# Patient Record
Sex: Female | Born: 1950 | Race: White | Hispanic: No | Marital: Married | Smoking: Never smoker
Health system: Southern US, Community
[De-identification: ages and names within clinical notes are randomized; demographics above are authoritative.]

## PROBLEM LIST (undated history)

## (undated) DIAGNOSIS — R519 Headache, unspecified: Secondary | ICD-10-CM

## (undated) DIAGNOSIS — K5900 Constipation, unspecified: Secondary | ICD-10-CM

## (undated) DIAGNOSIS — R51 Headache: Secondary | ICD-10-CM

## (undated) DIAGNOSIS — M1990 Unspecified osteoarthritis, unspecified site: Secondary | ICD-10-CM

## (undated) HISTORY — PX: TONSILLECTOMY: SUR1361

## (undated) HISTORY — PX: JOINT REPLACEMENT: SHX530

## (undated) HISTORY — PX: APPENDECTOMY: SHX54

## (undated) HISTORY — PX: TUBAL LIGATION: SHX77

## (undated) HISTORY — PX: EYE SURGERY: SHX253

## (undated) HISTORY — PX: OLECRANON BURSECTOMY: SHX2097

## (undated) HISTORY — PX: CARPAL TUNNEL RELEASE: SHX101

## (undated) HISTORY — PX: HERNIA REPAIR: SHX51

---

## 1998-03-17 ENCOUNTER — Ambulatory Visit (HOSPITAL_COMMUNITY): Admission: RE | Admit: 1998-03-17 | Discharge: 1998-03-17 | Payer: Self-pay | Admitting: Emergency Medicine

## 1998-07-04 ENCOUNTER — Emergency Department (HOSPITAL_COMMUNITY): Admission: EM | Admit: 1998-07-04 | Discharge: 1998-07-04 | Payer: Self-pay | Admitting: Emergency Medicine

## 1998-10-19 ENCOUNTER — Encounter: Payer: Self-pay | Admitting: Obstetrics and Gynecology

## 1998-10-19 ENCOUNTER — Ambulatory Visit: Admission: RE | Admit: 1998-10-19 | Discharge: 1998-10-19 | Payer: Self-pay | Admitting: Obstetrics and Gynecology

## 1999-12-15 ENCOUNTER — Encounter: Payer: Self-pay | Admitting: Obstetrics and Gynecology

## 1999-12-15 ENCOUNTER — Encounter: Admission: RE | Admit: 1999-12-15 | Discharge: 1999-12-15 | Payer: Self-pay | Admitting: Obstetrics and Gynecology

## 1999-12-29 ENCOUNTER — Encounter: Payer: Self-pay | Admitting: Obstetrics and Gynecology

## 1999-12-29 ENCOUNTER — Encounter: Admission: RE | Admit: 1999-12-29 | Discharge: 1999-12-29 | Payer: Self-pay | Admitting: Obstetrics and Gynecology

## 2000-01-02 ENCOUNTER — Ambulatory Visit (HOSPITAL_COMMUNITY): Admission: RE | Admit: 2000-01-02 | Discharge: 2000-01-02 | Payer: Self-pay | Admitting: Radiology

## 2000-01-02 ENCOUNTER — Encounter (INDEPENDENT_AMBULATORY_CARE_PROVIDER_SITE_OTHER): Payer: Self-pay | Admitting: Specialist

## 2000-01-02 ENCOUNTER — Other Ambulatory Visit: Admission: RE | Admit: 2000-01-02 | Discharge: 2000-01-02 | Payer: Self-pay | Admitting: Obstetrics and Gynecology

## 2001-01-09 ENCOUNTER — Encounter: Payer: Self-pay | Admitting: Obstetrics and Gynecology

## 2001-01-09 ENCOUNTER — Ambulatory Visit (HOSPITAL_COMMUNITY): Admission: RE | Admit: 2001-01-09 | Discharge: 2001-01-09 | Payer: Self-pay | Admitting: Obstetrics and Gynecology

## 2001-03-04 ENCOUNTER — Other Ambulatory Visit: Admission: RE | Admit: 2001-03-04 | Discharge: 2001-03-04 | Payer: Self-pay | Admitting: Obstetrics and Gynecology

## 2002-01-13 ENCOUNTER — Ambulatory Visit (HOSPITAL_COMMUNITY): Admission: RE | Admit: 2002-01-13 | Discharge: 2002-01-13 | Payer: Self-pay | Admitting: Obstetrics and Gynecology

## 2002-01-13 ENCOUNTER — Encounter: Payer: Self-pay | Admitting: Obstetrics and Gynecology

## 2002-03-06 ENCOUNTER — Other Ambulatory Visit: Admission: RE | Admit: 2002-03-06 | Discharge: 2002-03-06 | Payer: Self-pay | Admitting: Obstetrics and Gynecology

## 2002-04-23 ENCOUNTER — Ambulatory Visit (HOSPITAL_COMMUNITY): Admission: RE | Admit: 2002-04-23 | Discharge: 2002-04-23 | Payer: Self-pay | Admitting: Gastroenterology

## 2002-04-23 ENCOUNTER — Encounter (INDEPENDENT_AMBULATORY_CARE_PROVIDER_SITE_OTHER): Payer: Self-pay | Admitting: *Deleted

## 2003-01-27 ENCOUNTER — Encounter: Admission: RE | Admit: 2003-01-27 | Discharge: 2003-01-27 | Payer: Self-pay | Admitting: Obstetrics and Gynecology

## 2003-01-27 ENCOUNTER — Encounter: Payer: Self-pay | Admitting: Obstetrics and Gynecology

## 2003-03-09 ENCOUNTER — Other Ambulatory Visit: Admission: RE | Admit: 2003-03-09 | Discharge: 2003-03-09 | Payer: Self-pay | Admitting: Obstetrics and Gynecology

## 2003-11-14 ENCOUNTER — Emergency Department (HOSPITAL_COMMUNITY): Admission: AD | Admit: 2003-11-14 | Discharge: 2003-11-14 | Payer: Self-pay | Admitting: Family Medicine

## 2004-02-02 ENCOUNTER — Encounter: Admission: RE | Admit: 2004-02-02 | Discharge: 2004-02-02 | Payer: Self-pay | Admitting: Obstetrics and Gynecology

## 2004-03-09 ENCOUNTER — Other Ambulatory Visit: Admission: RE | Admit: 2004-03-09 | Discharge: 2004-03-09 | Payer: Self-pay | Admitting: Obstetrics and Gynecology

## 2005-02-07 ENCOUNTER — Encounter: Admission: RE | Admit: 2005-02-07 | Discharge: 2005-02-07 | Payer: Self-pay | Admitting: Obstetrics and Gynecology

## 2005-02-15 ENCOUNTER — Encounter: Admission: RE | Admit: 2005-02-15 | Discharge: 2005-02-15 | Payer: Self-pay | Admitting: Obstetrics and Gynecology

## 2005-03-27 ENCOUNTER — Other Ambulatory Visit: Admission: RE | Admit: 2005-03-27 | Discharge: 2005-03-27 | Payer: Self-pay | Admitting: Obstetrics and Gynecology

## 2006-02-08 ENCOUNTER — Encounter: Admission: RE | Admit: 2006-02-08 | Discharge: 2006-02-08 | Payer: Self-pay | Admitting: Obstetrics and Gynecology

## 2006-04-10 ENCOUNTER — Other Ambulatory Visit: Admission: RE | Admit: 2006-04-10 | Discharge: 2006-04-10 | Payer: Self-pay | Admitting: Obstetrics and Gynecology

## 2006-06-27 ENCOUNTER — Emergency Department (HOSPITAL_COMMUNITY): Admission: EM | Admit: 2006-06-27 | Discharge: 2006-06-27 | Payer: Self-pay | Admitting: Emergency Medicine

## 2006-10-25 ENCOUNTER — Ambulatory Visit (HOSPITAL_COMMUNITY): Admission: RE | Admit: 2006-10-25 | Discharge: 2006-10-25 | Payer: Self-pay | Admitting: Emergency Medicine

## 2007-02-12 ENCOUNTER — Encounter: Admission: RE | Admit: 2007-02-12 | Discharge: 2007-02-12 | Payer: Self-pay | Admitting: Obstetrics and Gynecology

## 2007-04-17 ENCOUNTER — Other Ambulatory Visit: Admission: RE | Admit: 2007-04-17 | Discharge: 2007-04-17 | Payer: Self-pay | Admitting: Obstetrics and Gynecology

## 2008-02-17 ENCOUNTER — Encounter: Admission: RE | Admit: 2008-02-17 | Discharge: 2008-02-17 | Payer: Self-pay | Admitting: Obstetrics and Gynecology

## 2008-04-17 ENCOUNTER — Other Ambulatory Visit: Admission: RE | Admit: 2008-04-17 | Discharge: 2008-04-17 | Payer: Self-pay | Admitting: Obstetrics & Gynecology

## 2008-06-15 ENCOUNTER — Encounter: Admission: RE | Admit: 2008-06-15 | Discharge: 2008-06-15 | Payer: Self-pay | Admitting: Obstetrics and Gynecology

## 2009-02-17 ENCOUNTER — Encounter: Admission: RE | Admit: 2009-02-17 | Discharge: 2009-02-17 | Payer: Self-pay | Admitting: Obstetrics and Gynecology

## 2010-02-18 ENCOUNTER — Encounter: Admission: RE | Admit: 2010-02-18 | Discharge: 2010-02-18 | Payer: Self-pay | Admitting: Obstetrics and Gynecology

## 2010-06-27 ENCOUNTER — Emergency Department (HOSPITAL_COMMUNITY): Admission: EM | Admit: 2010-06-27 | Discharge: 2010-06-28 | Payer: Self-pay | Admitting: Emergency Medicine

## 2010-06-29 ENCOUNTER — Inpatient Hospital Stay (HOSPITAL_COMMUNITY): Admission: EM | Admit: 2010-06-29 | Discharge: 2010-07-05 | Payer: Self-pay | Admitting: Emergency Medicine

## 2011-01-25 ENCOUNTER — Other Ambulatory Visit: Payer: Self-pay | Admitting: Obstetrics and Gynecology

## 2011-01-25 DIAGNOSIS — Z1231 Encounter for screening mammogram for malignant neoplasm of breast: Secondary | ICD-10-CM

## 2011-02-20 ENCOUNTER — Ambulatory Visit: Payer: Self-pay

## 2011-02-25 LAB — CBC
HCT: 33.6 % — ABNORMAL LOW (ref 36.0–46.0)
HCT: 34.8 % — ABNORMAL LOW (ref 36.0–46.0)
HCT: 35.4 % — ABNORMAL LOW (ref 36.0–46.0)
HCT: 35.9 % — ABNORMAL LOW (ref 36.0–46.0)
Hemoglobin: 11.7 g/dL — ABNORMAL LOW (ref 12.0–15.0)
Hemoglobin: 12.1 g/dL (ref 12.0–15.0)
Hemoglobin: 12.5 g/dL (ref 12.0–15.0)
MCH: 32.2 pg (ref 26.0–34.0)
MCH: 32.3 pg (ref 26.0–34.0)
MCHC: 33.3 g/dL (ref 30.0–36.0)
MCHC: 33.7 g/dL (ref 30.0–36.0)
MCHC: 34.3 g/dL (ref 30.0–36.0)
MCV: 92.4 fL (ref 78.0–100.0)
MCV: 92.9 fL (ref 78.0–100.0)
MCV: 93.3 fL (ref 78.0–100.0)
MCV: 93.4 fL (ref 78.0–100.0)
Platelets: 217 10*3/uL (ref 150–400)
Platelets: 246 10*3/uL (ref 150–400)
Platelets: 311 10*3/uL (ref 150–400)
Platelets: 316 10*3/uL (ref 150–400)
Platelets: ADEQUATE 10*3/uL (ref 150–400)
RBC: 3.69 MIL/uL — ABNORMAL LOW (ref 3.87–5.11)
RBC: 3.8 MIL/uL — ABNORMAL LOW (ref 3.87–5.11)
RBC: 3.89 MIL/uL (ref 3.87–5.11)
RDW: 12.1 % (ref 11.5–15.5)
RDW: 12.4 % (ref 11.5–15.5)
RDW: 12.9 % (ref 11.5–15.5)
WBC: 11.9 10*3/uL — ABNORMAL HIGH (ref 4.0–10.5)
WBC: 6.1 10*3/uL (ref 4.0–10.5)

## 2011-02-25 LAB — BASIC METABOLIC PANEL
BUN: 17 mg/dL (ref 6–23)
BUN: 8 mg/dL (ref 6–23)
CO2: 28 mEq/L (ref 19–32)
CO2: 28 mEq/L (ref 19–32)
CO2: 30 mEq/L (ref 19–32)
Calcium: 8.8 mg/dL (ref 8.4–10.5)
Chloride: 106 mEq/L (ref 96–112)
Chloride: 106 mEq/L (ref 96–112)
Chloride: 99 mEq/L (ref 96–112)
Creatinine, Ser: 0.67 mg/dL (ref 0.4–1.2)
Creatinine, Ser: 0.73 mg/dL (ref 0.4–1.2)
Creatinine, Ser: 0.75 mg/dL (ref 0.4–1.2)
Creatinine, Ser: 0.86 mg/dL (ref 0.4–1.2)
GFR calc Af Amer: >60 mL/min (ref >60)
GFR calc Af Amer: >60 mL/min (ref >60)
GFR calc non Af Amer: >60 mL/min (ref >60)
GFR calc non Af Amer: >60 mL/min (ref >60)
Glucose, Bld: 102 mg/dL — ABNORMAL HIGH (ref 70–99)
Glucose, Bld: 103 mg/dL — ABNORMAL HIGH (ref 70–99)
Glucose, Bld: 95 mg/dL (ref 70–99)
Potassium: 3.9 mEq/L (ref 3.5–5.1)
Potassium: 4.2 mEq/L (ref 3.5–5.1)
Potassium: 4.2 mEq/L (ref 3.5–5.1)
Potassium: 4.6 mEq/L (ref 3.5–5.1)
Sodium: 138 mEq/L (ref 135–145)
Sodium: 144 mEq/L (ref 135–145)

## 2011-02-25 LAB — ANAEROBIC CULTURE

## 2011-02-25 LAB — DIFFERENTIAL
Lymphocytes Relative: 10 % — ABNORMAL LOW (ref 12–46)
Lymphs Abs: 1.2 10*3/uL (ref 0.7–4.0)
Monocytes Relative: 9 % (ref 3–12)
Neutrophils Relative %: 81 % — ABNORMAL HIGH (ref 43–77)

## 2011-02-25 LAB — URINALYSIS, ROUTINE W REFLEX MICROSCOPIC
Bilirubin Urine: NEGATIVE
Ketones, ur: 15 mg/dL — AB
Specific Gravity, Urine: 1.019 (ref 1.005–1.030)
Urobilinogen, UA: 0.2 mg/dL (ref 0.0–1.0)

## 2011-02-25 LAB — CULTURE, BLOOD (ROUTINE X 2): Culture: NO GROWTH

## 2011-02-25 LAB — COMPREHENSIVE METABOLIC PANEL
ALT: 18 U/L (ref 0–35)
AST: 19 U/L (ref 0–37)
Albumin: 3.1 g/dL — ABNORMAL LOW (ref 3.5–5.2)
Calcium: 8.7 mg/dL (ref 8.4–10.5)
GFR calc Af Amer: >60 mL/min (ref >60)
Glucose, Bld: 107 mg/dL — ABNORMAL HIGH (ref 70–99)
Sodium: 139 mEq/L (ref 135–145)
Total Protein: 6 g/dL (ref 6.0–8.3)

## 2011-02-25 LAB — BODY FLUID CULTURE

## 2011-02-25 LAB — LACTIC ACID, PLASMA: Lactic Acid, Venous: 0.7 mmol/L (ref 0.5–2.2)

## 2011-03-07 ENCOUNTER — Ambulatory Visit
Admission: RE | Admit: 2011-03-07 | Discharge: 2011-03-07 | Disposition: A | Discharge status: Home / Self Care | Payer: Federal, State, Local not specified - PPO | Source: Ambulatory Visit | Attending: Obstetrics and Gynecology | Admitting: Obstetrics and Gynecology

## 2011-03-07 DIAGNOSIS — Z1231 Encounter for screening mammogram for malignant neoplasm of breast: Secondary | ICD-10-CM

## 2011-04-28 NOTE — Procedures (Signed)
[place]  Patient:    [name], [name] Visit Number: [id number] MRN: [id number]          Service Type: END Location: ENDO Attending Physician:  Charna Elizabeth Dictated by:   Anselmo Rod, M.D. Proc. Date: 04/23/02 Admit Date:  04/23/2002   CC:         Laqueta Linden, M.D.  Teena Irani. Arlyce Dice, M.D.   Procedure Report  DATE OF BIRTH:  1951/03/08  REFERRING PHYSICIAN:  Laqueta Linden, M.D.  PROCEDURE PERFORMED:  Colonoscopy with snare polypectomy times four.  ENDOSCOPIST:  Anselmo Rod, M.D.  INSTRUMENT USED:  Olympus video colonoscope.  INDICATIONS FOR PROCEDURE:  The patient is a 60 year old white female with a history of rectal bleeding.  Rule out colonic polyps, masses, hemorrhoids, etc.  The patient has a history of breast cancer in two sisters and a maternal aunt.  PREPROCEDURE PREPARATION:  Informed consent was procured from the patient. The patient was fasted for eight hours prior to the procedure and prepped with two bottles of Fleets  Phospho-Soda the night prior to the procedure and two Fleets enemas the morning of the procedure.  The patient was maintained on a liquid diet for two days prior to the procedure.  PREPROCEDURE PHYSICAL:  The patient had stable vital signs.  Neck supple. Chest clear to auscultation.  S1, S2 regular.  Abdomen soft with normal bowel sounds.  DESCRIPTION OF PROCEDURE:  The patient was placed in the left lateral decubitus position and sedated with 50 mg of Demerol and 7 mg of Versed intravenously.  Once the patient was adequately sedated and maintained on low-flow oxygen and continuous cardiac monitoring, the Olympus video colonoscope was advanced from the rectum to the cecum with extreme difficulty. There was a large amount of residual stool in the colon.  Multiple washes were done.  The patients position was changed from the left lateral to the supine and the right lateral position to  adequately visualize the entire colonic mucosa.  The appendiceal orifice and the ileocecal valve were visualized and photographed.  Three small polyps were snared from 45 cm.  A slightly larger sessile polyp was snared from 25 cm.  This measured about 4 to 5 mm in size. Prominent internal hemorrhoids and small external hemorrhoids were seen on retroflexion and anal inspection, respectively.  The patient tolerated the procedure well without complication.  IMPRESSION: 1. Small external hemorrhoids. 2. Small nonbleeding internal hemorrhoid. 3. Four colonic polyps removed from left colon as described above. 4. Large amount of residual stool in the colon.  Small lesions could have    been missed.  RECOMMENDATIONS: 1. Repeat colorectal cancer screening will be done depending on the pathology    results and the patients symptoms in the near future. 2. A high fiber diet has been discussed with the patient in great detail. 3. All nonsteroidals including aspirin are to be avoided for the next three to    four weeks. 4. Outpatient follow-up in the next two weeks for further recommendations.Dictated by:   Anselmo Rod, M.D. Attending Physician:  Charna Elizabeth DD:  04/23/02 TD:  04/24/02 Job: [id number]

## 2011-08-16 ENCOUNTER — Emergency Department (HOSPITAL_COMMUNITY)
Admission: EM | Admit: 2011-08-16 | Discharge: 2011-08-16 | Disposition: A | Discharge status: Home / Self Care | Payer: PRIVATE HEALTH INSURANCE | Attending: Emergency Medicine | Admitting: Emergency Medicine

## 2011-08-16 ENCOUNTER — Emergency Department (HOSPITAL_COMMUNITY): Payer: PRIVATE HEALTH INSURANCE

## 2011-08-16 DIAGNOSIS — S60219A Contusion of unspecified wrist, initial encounter: Secondary | ICD-10-CM | POA: Insufficient documentation

## 2011-08-16 DIAGNOSIS — R209 Unspecified disturbances of skin sensation: Secondary | ICD-10-CM | POA: Insufficient documentation

## 2011-08-16 DIAGNOSIS — Y990 Civilian activity done for income or pay: Secondary | ICD-10-CM | POA: Insufficient documentation

## 2011-08-16 DIAGNOSIS — M7989 Other specified soft tissue disorders: Secondary | ICD-10-CM | POA: Insufficient documentation

## 2011-08-16 DIAGNOSIS — X500XXA Overexertion from strenuous movement or load, initial encounter: Secondary | ICD-10-CM | POA: Insufficient documentation

## 2011-08-16 DIAGNOSIS — S6990XA Unspecified injury of unspecified wrist, hand and finger(s), initial encounter: Secondary | ICD-10-CM | POA: Insufficient documentation

## 2011-08-16 DIAGNOSIS — M25539 Pain in unspecified wrist: Secondary | ICD-10-CM | POA: Insufficient documentation

## 2011-08-16 DIAGNOSIS — M25439 Effusion, unspecified wrist: Secondary | ICD-10-CM | POA: Insufficient documentation

## 2011-08-16 DIAGNOSIS — S59909A Unspecified injury of unspecified elbow, initial encounter: Secondary | ICD-10-CM | POA: Insufficient documentation

## 2011-08-21 ENCOUNTER — Other Ambulatory Visit (HOSPITAL_COMMUNITY): Payer: Self-pay | Admitting: Family Medicine

## 2011-08-21 ENCOUNTER — Other Ambulatory Visit: Payer: Self-pay | Admitting: Occupational Medicine

## 2011-08-21 DIAGNOSIS — S63501A Unspecified sprain of right wrist, initial encounter: Secondary | ICD-10-CM

## 2011-08-22 ENCOUNTER — Ambulatory Visit (HOSPITAL_COMMUNITY)
Admission: RE | Admit: 2011-08-22 | Discharge: 2011-08-22 | Disposition: A | Discharge status: Home / Self Care | Payer: Federal, State, Local not specified - PPO | Source: Ambulatory Visit | Attending: Occupational Medicine | Admitting: Occupational Medicine

## 2011-08-22 DIAGNOSIS — M19039 Primary osteoarthritis, unspecified wrist: Secondary | ICD-10-CM | POA: Insufficient documentation

## 2011-08-22 DIAGNOSIS — S63501A Unspecified sprain of right wrist, initial encounter: Secondary | ICD-10-CM

## 2011-08-22 DIAGNOSIS — M654 Radial styloid tenosynovitis [de Quervain]: Secondary | ICD-10-CM | POA: Insufficient documentation

## 2012-02-04 ENCOUNTER — Emergency Department (INDEPENDENT_AMBULATORY_CARE_PROVIDER_SITE_OTHER): Payer: Federal, State, Local not specified - PPO

## 2012-02-04 ENCOUNTER — Encounter (HOSPITAL_COMMUNITY): Payer: Self-pay | Admitting: Emergency Medicine

## 2012-02-04 ENCOUNTER — Emergency Department (INDEPENDENT_AMBULATORY_CARE_PROVIDER_SITE_OTHER)
Admission: EM | Admit: 2012-02-04 | Discharge: 2012-02-04 | Disposition: A | Discharge status: Home / Self Care | Payer: Federal, State, Local not specified - PPO | Source: Home / Self Care | Attending: Family Medicine | Admitting: Family Medicine

## 2012-02-04 DIAGNOSIS — M7050 Other bursitis of knee, unspecified knee: Secondary | ICD-10-CM

## 2012-02-04 DIAGNOSIS — M76899 Other specified enthesopathies of unspecified lower limb, excluding foot: Secondary | ICD-10-CM

## 2012-02-04 DIAGNOSIS — M76892 Other specified enthesopathies of left lower limb, excluding foot: Secondary | ICD-10-CM

## 2012-02-04 DIAGNOSIS — M6580 Other synovitis and tenosynovitis, unspecified site: Secondary | ICD-10-CM

## 2012-02-04 MED ORDER — MELOXICAM 15 MG PO TABS: 15.0000 mg | ORAL_TABLET | Freq: Every day | ORAL | Status: AC

## 2012-02-04 MED ORDER — HYDROCODONE-ACETAMINOPHEN 5-325 MG PO TABS: 1.0000 | ORAL_TABLET | Freq: Three times a day (TID) | ORAL | Status: AC | PRN

## 2012-02-04 NOTE — ED Notes (Signed)
Pt having left knee pain for about 10 days. Was walk/running and although she didn't twist or fall or jolt she got a instant sharp pain in her left knee. The pain has gotten somewhat better but still hurts quite a bit when bending and especially walking stairs. Pt denies history of swelling.

## 2012-02-04 NOTE — Discharge Instructions (Signed)
If not better in 1 week please see Orthopedic of choice may need MRI of the L knee.    Bursitis Bursitis is a swelling and soreness (inflammation) of a fluid-filled sac (bursa) that overlies and protects a joint. It can be caused by injury, overuse of the joint, arthritis or infection. The joints most likely to be affected are the elbows, shoulders, hips and knees. HOME CARE INSTRUCTIONS   Apply ice to the affected area for 15 to 20 minutes each hour while awake for 2 days. Put the ice in a plastic bag and place a towel between the bag of ice and your skin.   Rest the injured joint as much as possible, but continue to put the joint through a full range of motion, 4 times per day. (The shoulder joint especially becomes rapidly "frozen" if not used.) When the pain lessens, begin normal slow movements and usual activities.   Only take over-the-counter or prescription medicines for pain, discomfort or fever as directed by your caregiver.   Your caregiver may recommend draining the bursa and injecting medicine into the bursa. This may help the healing process.   Follow all instructions for follow-up with your caregiver. This includes any orthopedic referrals, physical therapy and rehabilitation. Any delay in obtaining necessary care could result in a delay or failure of the bursitis to heal and chronic pain.  SEEK IMMEDIATE MEDICAL CARE IF:   Your pain increases even during treatment.   You develop an oral temperature above 102 F (38.9 C) and have heat and inflammation over the involved bursa.  MAKE SURE YOU:   Understand these instructions.   Will watch your condition.   Will get help right away if you are not doing well or get worse.  Document Released: 11/24/2000 Document Revised: 08/09/2011 Document Reviewed: 10/29/2009 [facility] Patient Information 2012 [place], [place].Knee Problems, Questions and Answers Knee problems are common in young people and adults. This document contains  general information about several knee problems. It includes:  Descriptions of the different parts of the knee.   Diagram of the different parts of the knee.  Individual sections describe specific types of knee injuries and their:   Symptoms.   Diagnosis.   Treatment.  Information on how to prevent these problems is also provided. WHAT DO THE KNEES DO? HOW DO THEY WORK? The knees provide stable support for the body. Knees allow the legs to bend and straighten. Flexibility and stability are needed for standing and for motions like:  Walking.   Jumping.   Running.   Turning.   Crouching.  Supporting and moving parts help the knees do their job, these parts include:  Bones.   Cartilage.   Muscles.   Ligaments.   Tendons.  Any of these parts can be involved in knee pain or a knee not working right (dysfunction). WHAT CAUSES KNEE PROBLEMS? There are two general kinds of knee problems: mechanical and inflammatory. Mechanical Knee Problems are problems that result from:  Injury, such as a direct blow or sudden movements that strain the knee beyond its normal range of movement.   Overuse, repetitive motions that produce partial fiber failure in tendon or ligaments.   Osteoarthritis in the knee, result from wear and tear on its parts.  Inflammatory Knee Problems are inflammation that occurs in certain rheumatic diseases, such as:   Rheumatoid arthritis.   Systemic lupus erythematosus.  JOINT BASICS  The point at which two or more bones are connected is called a joint.  In all joints, the bones are kept from grinding against each other by the tissue lining the ends of the bones called cartilage.   Bones are joined to bones by strong, elastic bands of tissue called ligaments.   Tendons are tough cords of tissue that connect muscle to bone.   Muscles work in opposing pairs to bend and straighten joints. While muscles are not technically part of a joint, they are  important because strong muscles help support and protect joints.  WHAT ARE THE PARTS OF THE KNEE? Like any joint, the knee is composed of bones and cartilage, ligaments, tendons, and muscles. BONES AND CARTILAGE The knee joint is the junction of four bones:   The thigh bone or upper leg bone (femur).   The shin bone or larger bone of the lower leg (tibia).   The small bone on the outside of the knee where ligaments attach (fibula).   The knee cap (patella). The patella is 2 to 3 inches wide and 3 to 4 inches long. It sits over the other bones at the front of the knee joint and slides when the leg moves. It protects the knee and gives leverage to muscles.  The ends of the bones in the knee joint are covered with articular cartilage, a tough, elastic material that helps absorb shock and allows the knee joint to move smoothly. Separating the bones of the knee are pads of connective tissue which are called meniscus. The plural is menisci. The menisci are divided into two crescent-shaped discs positioned between the tibia and femur on the outer and inner sides of each knee. The two menisci in each knee act as shock absorbers, cushioning the lower part of the leg from the weight of the rest of the body as well as enhancing stability. MUSCLES There are two groups of muscles at the knee.  The quadriceps muscle are four muscles on the front of the thigh that work to straighten the leg from a bent position.   The hamstring muscles, which bend the leg at the knee, run along the back of the thigh from the hip to just below the knee.  Keeping these muscles strong with exercises such as walking up stairs or riding a stationary bicycle helps support and protect the knee. TENDONS AND LIGAMENTS  The quadriceps tendon connects the quadriceps muscle to the patella and provides the power to extend the leg. The patella is a bone within this tendon. Four ligaments connect the femur and tibia and give the joint  strength and stability:   The medial collateral ligament (MCL) provides stability to the inner (medial) part of the knee.   The lateral collateral ligament (LCL) provides stability to the outer (lateral) part of the knee.   The anterior cruciate ligament (ACL), in the center of the knee, limits rotation and the forward movement of the tibia.   The posterior cruciate ligament (PCL), also in the center of the knee, limits backward movement of the tibia.   Other ligaments are part of the knee capsule. This is the protective, fiber-like structure that wraps around the knee joint. Inside the capsule, the joint is lined with a thin, soft tissue called synovium. This tissue produces the fluid (synovial fluid) which lubricates the joint.  HOW ARE KNEE PROBLEMS DIAGNOSED? Caregivers use several methods to diagnose knee problems:  Medical history--The patient tells the caregiver details about:   Symptoms.   Injuries.   Medical conditions.   Physical examination-- To help the  caregiver understand how the knee is working, the patient may be asked to stand, walk or squat. The caregiver, to discover the limits of movement and the location of pain in the knee, may:   Bend the knee.   Straighten the knee.   Rotate (turn) turn the knee.   Press on the knee to feel for injury.   Diagnostic tests--The caregiver uses one or more stress tests to determine the nature of a knee problem.   X-ray (radiography)--An x-ray beam is passed through the knee to produce a two-dimensional picture of the bones.   Computerized axial tomography (CAT) scan--X-rays are passed through the knee at different angles, detected by a scanner, and analyzed by a computer. This produces a series of clear cross-sectional images ("slices") of the knee tissues on a computer screen. CAT scan images show details of bone structure, show soft tissues such as ligaments or muscles to a limited degree, can give a three-dimensional view of  the knee.   Bone scan (radionuclide scanning)--A very small amount of radioactive material is injected into the patient's bloodstream and detected by a scanner. This test detects blood flow to the bone and cell activity within the bone and can show abnormalities. This may help the caregiver understand what is wrong.   Magnetic resonance imaging (MRI)--Energy from a powerful magnet (rather than x-rays) stimulates knee tissue to produce signals. These signals are detected by a scanner and analyzed by a computer. Like a CAT scan, a computer is used to produce three-dimensional views of the knee during MRI. The MRI provides precise details of ligament, tendon and cartilage structure.   Arthroscopy--The surgeon manipulates a small, lighted optic tube (arthroscope) that has been inserted into the joint through a small incision in the knee. Images of the inside of the knee joint are projected onto a television screen. While the arthroscope is inside the knee joint, removal of loose pieces of bone or cartilage, or the repair of torn ligaments and menisci can be preformed.   Biopsy--The caregiver removes tissue to examine under a microscope.   Aspiration of fluid from the knee--The laboratory will analyze the fluid for cell count, presence of crystals that produce inflammation (as in gout where Uric Acid crystals are the cause of the inflammation) and check for infection.  WHAT IS ARTHRITIS OF THE KNEE?   Arthritis of the knee is most often osteoarthritis. In this disease, the cartilage in the joint gradually wears away. It may be caused by excess stress on the joint from:   Trauma.   Deformity.   Repeated injury.   Excess weight.   It most often affects middle-aged and older people. A young person who develops osteoarthritis may have an inherited form of the disease or may have experienced continuous irritation from an unrepaired knee injury or other injury.   In rheumatoid arthritis, which can also  affect the knees, the joint becomes inflamed and cartilage may be destroyed. Rheumatoid arthritis often affects people at an earlier age than osteoarthritis and often involves multiple joints.   Arthritis can also affect supporting structures such as muscles, tendons, and ligaments.  WHAT ARE SIGNS OF ARTHRITIS OF THE KNEE?  Someone who has arthritis of the knee may experience:   Pain.   Swelling/ fluid on the knee.   A decrease in knee motion.   A common symptom is morning stiffness. This generally improves as the person moves around.   Sometimes the joint locks or clicks. These signs may occur  in other knee disorders as well.   The caregiver may confirm the diagnosis by:   Performing a physical examination.   Examining x-rays, which typically show a loss of joint space.   Blood tests may be helpful for diagnosing rheumatoid arthritis, but other tests may be needed.   Analyzing fluid from the knee joint may be helpful in diagnosing some kinds of arthritis.   The caregiver may use arthroscopy to directly see damage to cartilage, tendons, and ligaments and to confirm a diagnosis. Arthroscopy is usually done only if a repair procedure is to be performed.  WHAT IS TREATMENT FOR ARTHRITIS OF THE KNEE?  Most often osteoarthritis of the knee is treated with pain-reducing medicines, such as:   Nonsteroidal anti-inflammatory drugs (NSAID's)   Exercises to restore joint movement and strengthen the knee.   Losing excess weight can also help people with osteoarthritis.   Rheumatoid arthritis of the knee may require physical therapy and more powerful medicines. In people with severe arthritis of the knee, a seriously damaged joint may need to be replaced with an artificial one.  WHAT IS CHONDROMALACIA?  Chondromalacia refers to softening of the articular cartilage of the knee cap. This disorder occurs most often in young adults. Instead of gliding smoothly across the lower end of the  thigh bone, the knee cap rubs against it, thereby roughening the cartilage underneath the knee cap. The damage may range from a slightly abnormal surface of the cartilage to a surface that has been worn away to the bone. It can be caused by:   Injury.   Overuse.   Misalignment of the patellar tendon.   Muscle weakness (generally the quadriceps).   Chondromalacia related to injury occurs when a blow to the knee cap tears off either a small piece of cartilage or a large fragment containing a piece of bone.  WHAT ARE SYMPTOMS OF CHONDROMALACIA?  The most frequent symptom is a dull pain around or under the knee cap. This pain worsens when walking down stairs, or hills or sitting with the knee bent for long periods of time.   A person may also feel pain when climbing stairs or when the knee bears weight as it straightens.   The disorder is common in:   Runners.   Skiers.   Cyclists.   Soccer players.   A patient's description of symptoms, the physical exam, and a follow-up x-ray usually help the caregiver make a diagnosis.   Although arthroscopy can confirm the diagnosis. It is not used unless the condition requires extensive treatment.  WHAT IS TREATMENT FOR CHONDROMALACIA?  Many caregivers recommend that patients with chondromalacia perform low-impact exercises. The knee must not bend more than 90 degrees. This includes:   Swimming.   Riding a stationary bicycle.   Using a cross-country ski machine.   Electrical stimulation may also be used to strengthen the muscles.   If these treatments do not improve the condition, the caregiver may perform arthroscopic. Goals of this surgery include smoothing the surface of the cartilage and "washing out" the cartilage fragments that cause the joint to catch during bending and straightening.   In more severe cases, surgery may be necessary to:   Correct the alignment of the knee cap.   Decrease the pressure on the undersurface of the  patella.   Relieve friction with the cartilage.   Reposition parts that are out of alignment.  WHAT CAUSES INJURIES TO THE MENISCUS? The meniscus is easily injured by the force  of rotating the knee while bearing weight. A partial or total tear may occur when a person quickly twists or rotates the upper leg while the foot stays still. For example, when dribbling a basketball around an opponent or turning to hit a tennis ball. If the tear is tiny, the meniscus stays connected to the front and back of the knee. If the tear is large, the meniscus may be left in an abnormally mobile position which produces instability. The seriousness of a tear depends on its location and extent. WHAT ARE SYMPTOMS OF INJURIES TO THE MENISCUS?  Pain, particularly when the knee is straightened.   If the pain is mild, the patient may continue with normal activity.   Severe pain may occur if a fragment of the meniscus catches between the femur and the tibia.   Swelling may occur soon after injury if blood vessels are disrupted. Swelling may occur several hours later if the joint fills with fluid produced by the joint lining (synovium) as a result of inflammation. If the synovium is injured, it may become inflamed and produce fluid. This makes the knee swell.   Sometimes, an injury that occurred in the past but was not treated becomes painful months or years later.   After any injury, the knee may click, lock, feel weak, or give way without warning.   Although symptoms of meniscal injury may disappear on their own (particularly with a stable meniscal tear), they frequently persist or return and require treatment.  HOW IS INJURY TO THE MENISCUS DIAGNOSED?  The caregiver will listen to the patient's description of the pain and swelling. The caregiver will perform a physical examination and take x-rays of the knee. The examination may include a test in which the caregiver bends the leg, and then rotates the leg outward  and inward while extending it. Pain along the joint line or an audible click suggests a meniscal tear.   An MRI may be done.   Occasionally, the caregiver may use arthroscopy without obtaining the MRI to diagnose and treat a meniscal tear.  WHAT IS TREATMENT FOR INJURY TO THE MENISCUS?  The caregiver may recommend a muscle-strengthening program if:   The tear is minor.   The pain and symptoms are improving.   Exercises for meniscal problems are best started with guidance from a caregiver and physical therapist or athletic trainer. The therapist will make sure that the patient does the exercises properly and without risking new or repeat injury. The following exercises after injury to the meniscus are designed to build up the quadriceps and hamstring muscles and increase flexibility and strength.   Warming up the joint by riding a stationary bicycle, then straightening and raising the leg (but not straightening it too much).   Extending the leg while sitting (a weight may be worn on the ankle for this exercise).   Raising the leg while lying on the stomach.   Exercising in a pool (walking as fast as possible in chest-deep water, performing small flutter kicks while holding onto the side of the pool, and raising each leg to 90 in chest-deep water while pressing the back against the side of the pool).   If the tear is more extensive, the caregiver may perform arthroscopic with or without open surgery to see the extent of injury and to repair the tear. The caregiver can sew the meniscus back in place if the patient is relatively young, if the injury is in an area with a good blood  supply, and if the ligaments are intact. Most young athletes are able to return to active sports after meniscus repair.   If the patient is elderly or the tear is in an area with a poor blood supply, the caregiver may trim a small portion of the meniscus to even the surface. In rare cases, the caregiver removes the  entire meniscus. Osteoarthritis is more likely to develop in the knee if the entire meniscus is removed.   Recovery after surgical repair takes several weeks to months. Activity after surgery is slightly more restricted than when the meniscus is partially removed. However, putting weight on the joint actually helps recovery. Regardless of the form of surgery, rehabilitation usually includes:   Walking.   Bending the legs.   Doing exercises that stretch and build up leg muscles.   The best results of treatment for meniscal injury are obtained in people who:   Do not have articular cartilage changes.   Have an intact ACL.  LIGAMENT INJURIES WHAT ARE THE CAUSES OF ANTERIOR AND POSTERIOR CRUCIATE LIGAMENT INJURIES?  Injury to the cruciate ligaments is sometimes referred to as a "sprain".   The ACL is most often stretched or torn (or both) by a sudden twisting or pushing the ACL beyond its normal range. For example, when the feet are planted one way and the knee rotates in the opposite direction.   The PCL is most often injured by a direct impact, such as in an automobile accident or football tackle.  WHAT ARE SYMPTOMS?  Injury to a cruciate ligament may not cause pain. Symptoms may include:   A popping sound   Buckling when trying to stand on the leg.   The caregiver will perform several physical exam tests. These tests are to see whether the parts of the knee stay in proper position when pressure is applied in different directions.   A thorough examination is essential. An MRI is very accurate in detecting a complete tear. Arthroscopy may be the only reliable means of detecting a partial one.  TREATMENT  For an incomplete tear, the caregiver may recommend that the patient begin an exercise program to strengthen surrounding muscles.   The caregiver may also prescribe a brace to protect the knee during activity.   For a completely torn ACL in an active athlete and motivated person,  the caregiver is likely to recommend surgery. The surgeon may reconstruct the torn ligament by using:   A piece (graft) of healthy ligament from the patient (autograft)   A piece of ligament from a tissue bank (allograft). One of the most important elements in a patient's successful recovery after cruciate ligament surgery is a 4- to 55-month exercise program. This program may involve using special exercise equipment at a rehabilitation or sports center. Successful surgery special exercises will allow the patient to return to a normal, active lifestyle.  MEDIAL AND LATERAL COLLATERAL LIGAMENT INJURIES WHAT IS THE MOST COMMON CAUSE OF MEDIAL AND LATERAL COLLATERAL LIGAMENT INJURIES? The MCL is more commonly injured than the LCL. The cause is most often a blow to the outer side of the knee. This injury stretches and tears the ligament on the inner side of the knee. Such blows frequently occur in contact sports like football or hockey. SYMPTOMS AND DIAGNOSIS  When injury to the MCL occurs, a person may feel a pop and the knee may buckle sideways.   Pain and swelling are common.   A thorough exam is needed to determine the  kind and extent of the injury.   To diagnose a collateral ligament injury, the caregiver exerts pressure on the side of the knee to determine the degree of pain and the looseness of the joint.   An MRI is helpful in diagnosing injuries to these ligaments.  TREATMENT  Most sprains of the collateral ligaments will heal if the patient follows a prescribed exercise program.   In addition to exercise, the caregiver may recommend ice packs to reduce pain and swelling and a small sleeve-type brace to protect and stabilize the knee.   A sprain may take 4 to 6 weeks to heal.   A patient with a severely sprained or torn collateral ligament may also have a torn ACL. This usually requires surgical repair.  TENDON INJURIES AND DISORDERS WHAT CAUSES TENDINITIS AND RUPTURED  TENDONS?  Knee tendon injuries range from tendinitis to a torn (ruptured) tendon.   If a person overuses a tendon during certain activities such as dancing, cycling, or running, the tendon stretches like a worn-out rubber band and becomes inflamed.   Also, trying to break a fall may cause the quadriceps muscles to contract and tear the quadriceps tendon above the patella or the patellar tendon below the patella. This type of injury is most likely to happen in older people.   Tendinitis of the patellar tendon is sometimes called jumper's knee because in sports that require jumping, such as basketball or volleyball, the muscle contraction and force of hitting the ground after a jump strain the tendon.   After repeated stress, the tendon may become inflamed or tear.  SYMPTOMS AND DIAGNOSIS  People with tendinitis often have tenderness at the point where the patellar tendon meets the bone. In addition, they may feel pain during running, fast walking, or jumping.   A complete rupture of the quadriceps or patellar tendon is painful. It also makes it difficult for a person to bend, extend, or lift the leg against gravity.   If there is not much swelling, the caregiver may be able to feel a defect in the tendon near the tear during a physical examination.   An x-ray will show that the patella is lower than normal in a quadriceps tendon tear and higher than normal in a patellar tendon tear. The caregiver may use an MRI to confirm a partial or total tear.  TREATMENT  Initially, the caregiver may ask a patient with tendinitis to rest, elevate, and apply ice to the knee and to take medicines to relieve pain and decrease inflammation and swelling.   If the quadriceps or patellar tendon is completely ruptured, a surgeon will reattach the ends. After surgery, the patient will wear a cast or brace for 3 to 6 weeks and use crutches.   For a partial tear, the caregiver might apply a cast or an extension knee  brace without performing surgery.   Rehabilitating a partial or complete tear of a tendon requires an exercise program that is similar to but less forceful than that used for ligament injuries. The goals of exercise are to restore the ability to bend and straighten the knee and to strengthen the leg to prevent repeat injury. A rehabilitation program may last 4 to 6 months. A patient can return to many activities before then.  OSGOOD-SCHLATTER DISEASE WHAT CAUSES OSGOOD-SCHLATTER DISEASE?  Osgood-Schlatter disease is caused by repetitive stress or tension on part of the growth area of the upper tibia (the apophysis). Symptoms included inflammation of the patellar tendon and  surrounding soft tissues at the point where the tendon attaches to the tibia.   The disease may also be associated with an injury in which the tendon is stretched so much that it tears away from the tibia and takes a fragment of bone with it.   The disease generally affects active young people. Particularly boys between the ages of 79 and 57, who play games or sports that include frequent running and jumping and who have open growth plates.  SYMPTOMS AND DIAGNOSIS  People with this disease experience pain just below the knee joint. This pain usually worsens with activity and is relieved by rest.   The bony bump that is particularly painful when pressed may increase in size at the upper edge of the tibia (below the knee cap).   Usually motion of the knee is not affected.   Pain may last a few months and may come back with periods of high activity until the child's growth is completed.   Osgood-Schlatter disease is most often diagnosed by the symptoms and the physical exam. An x-ray may be normal, or show an injury. An x-ray, more typically, will show that the growth area is fragmented.  TREATMENT  Usually, the disease goes away without aggressive treatment.   Applying ice to the knee when pain begins helps relieve  inflammation. Applying ice is sometimes used along with stretching and strengthening exercises.   The caregiver may advise the patient to limit participation in vigorous sports. Children who wish to continue less stressful sports activities may need to wear knee pads for protection and apply ice to the knee after activity. If there is a great deal of pain, sports activities may be limited until discomfort becomes tolerable.  ILIOTIBIAL BAND SYNDROME WHAT CAUSES ILIOTIBIAL BAND SYNDROME? This is an overuse condition in which inflammation results when a band of a tendon rubs over the outer bone of the knee. Although iliotibial band syndrome may be caused by direct injury to the knee, it is most often caused by the stress of long-term overuse. SYMPTOMS AND DIAGNOSIS  A person with this syndrome feels an ache or burning sensation at the outside of the knee during activity. Pain may be localized at the outside of the knee or radiate up the side of the thigh.   A person may also feel a snap when the knee is bent and then straightened.   Swelling may be absent and knee motion is normal.   The diagnosis of this disorder is typically based on the symptoms. Symptoms include pain at the outer side of the knee. Other problems with similar symptoms must also be excluded.  TREATMENT  Usually, the problem disappears if the person reduces activity and performs stretching exercises followed by muscle-strengthening exercises.   In rare cases when the syndrome does not disappear, surgery may be necessary to split the tendon so it is not stretched too tightly over the bone.  OTHER KNEE INJURIES WHAT IS OSTEOCHONDRITIS DISSECANS?  Osteochondritis dissecans results from a loss of the blood supply to an area of bone at the joint surface and usually involves the knee. The affected bone and its covering of cartilage gradually loosen and cause pain.   This problem usually arises in an active adolescent or young  adult. It may be due to a slight blockage of a small artery or to an unrecognized injury or tiny fracture that damages the overlying cartilage.   Lack of a blood supply can cause bone to break down (  avascular necrosis).   The involvement of several joints or the appearance of the condition in several family members may indicate that the disorder is inherited.   A person with this condition may eventually develop osteoarthritis.  SYMPTOMS AND DIAGNOSIS  If normal healing does not occur, cartilage separates from the diseased bone and a fragment breaks loose into the knee joint. This causes weakness, sharp pain, and locking of the joint.   An x-ray, MRI, or arthroscopy can determine the condition of the cartilage and can be used to diagnose osteochondritis dissecans.  TREATMENT  If cartilage fragments have not broken loose, a surgeon may fix the cartilage and underlying bone in place with pins or screws. These pins or screws are sunk into the cartilage to stimulate a new blood supply.   If fragments are loose, the surgeon may scrape down the cavity to reach fresh bone and add a bone graft and fix the fragments in position. Fragments that cannot be mended are removed, and the cavity is drilled or scraped to stimulate new cartilage growth.   Research is being done to assess the use of cartilage cell implants and other tissue transplants to treat this disorder.  WHAT IS PLICA SYNDROME?  Plica syndrome occurs when plicae (bands of synovial tissue) are irritated by overuse or injury.   Synovial plicae are the remains of tissue pouches found in the early stages of fetal development. As the fetus develops, these pouches normally combine to form one large synovial cavity. If this process is incomplete, plicae remain as folds or bands of synovial tissue within the knee.   Injury, chronic overuse, or inflammatory conditions are associated with this syndrome.  SYMPTOMS AND DIAGNOSIS  People with this  syndrome are likely to experience pain and swelling, a clicking sensation, and locking and weakness of the knee.   Because the symptoms are similar to those of some other knee problems, plica syndrome is often misdiagnosed. Diagnosis usually depends on excluding other conditions that cause similar symptoms.  TREATMENT  The goal of treatment is to reduce inflammation of the synovium and thickening of the plicae.   The caregiver usually prescribes medicine to reduce inflammation.   The patient is also advised to reduce activity, apply ice and an elastic bandage to the knee, and do strengthening exercises.   A cortisone injection into the plica folds helps about half of those treated.   If treatment fails to relieve symptoms within 3 months, the caregiver may recommend arthroscopic or open surgery to remove the plicae.  WHAT KINDS OF CAREGIVERS TREAT KNEE PROBLEMS?  Extensive injuries and diseases of the knees are usually treated by an orthopedic surgeon, a doctor who has been trained in the nonsurgical and surgical treatment of bones, joints, and soft tissues such as ligaments, tendons, and muscles.   Patients seeking nonsurgical treatment of arthritis of the knee may also consult a rheumatologist. This is a caregiver specializing in the diagnosis and treatment of arthritis and related disorders.  HOW CAN PEOPLE PREVENT KNEE PROBLEMS? Some knee problems cannot be foreseen or prevented. However, a person can prevent many knee problems by following these suggestions:  Before exercising or playing sports, warm up by walking or riding a stationary bicycle, and then do stretches. Stretching the muscles in the front of the thigh (quadriceps) and back of the thigh (hamstrings) reduces tension on the tendons. Stretching also relieves pressure on the knee during activity.   Strengthen the leg muscles by doing specific exercises (for example,  by walking up stairs or hills, or by riding a stationary  bicycle). A supervised workout with weights is another way to strengthen the leg muscles that support the knee.   Avoid sudden changes in the intensity of exercise. Increase the force or duration of activity gradually.   Wear shoes that both fit properly and are in good condition. Good shoes will help maintain balance and leg alignment when walking or running. Knee problems can be caused by flat feet or feet that roll inward. People can often reduce some of these problems by wearing special shoe inserts (orthotics).   Maintain a healthy weight to reduce stress on the knee. Obesity increases the risk of degenerative (wearing) conditions such as osteoarthritis of the knee.  WHAT TYPES OF EXERCISE ARE MOST SUITABLE FOR SOMEONE WITH KNEE PROBLEMS? Three types of exercise are best for people with arthritis:  Range-of-motion exercises help maintain normal joint movement and relieve stiffness. This type of exercise helps maintain or increase flexibility.   Strengthening exercises help maintain or increase muscle strength. Strong muscles help support and protect joints affected by arthritis.   Aerobic or endurance exercises improve function of the heart and circulation and help control weight. Weight control can be important to people who have arthritis because extra weight puts pressure on many joints. Some studies show that aerobic exercise can reduce inflammation in some joints.  WHERE CAN PEOPLE FIND MORE INFORMATION ABOUT KNEE PROBLEMS? [employer]th[employer] and Musculoskeletal [employer] Skin: www.niams.SelfMillionaire.nl American Academy of Orthopedic Surgeons: www.aaos.org [employer] Rheumatology: www.rheumatology.org American Physical Therapy Association: www.apta.org Arthritis Foundation: www.arthritis.org Document Released: 11/30/2003 Document Revised: 08/09/2011 Document Reviewed: 03/17/2009 [facility] [employer] Patient Information 2012 [place], [place].Knee Problems, Questions and Answers Knee  problems are common in young people and adults. This document contains general information about several knee problems. It includes:  Descriptions of the different parts of the knee.   Diagram of the different parts of the knee.  Individual sections describe specific types of knee injuries and their:   Symptoms.   Diagnosis.   Treatment.  Information on how to prevent these problems is also provided. WHAT DO THE KNEES DO? HOW DO THEY WORK? The knees provide stable support for the body. Knees allow the legs to bend and straighten. Flexibility and stability are needed for standing and for motions like:  Walking.   Jumping.   Running.   Turning.   Crouching.  Supporting and moving parts help the knees do their job, these parts include:  Bones.   Cartilage.   Muscles.   Ligaments.   Tendons.  Any of these parts can be involved in knee pain or a knee not working right (dysfunction). WHAT CAUSES KNEE PROBLEMS? There are two general kinds of knee problems: mechanical and inflammatory. Mechanical Knee Problems are problems that result from:  Injury, such as a direct blow or sudden movements that strain the knee beyond its normal range of movement.   Overuse, repetitive motions that produce partial fiber failure in tendon or ligaments.   Osteoarthritis in the knee, result from wear and tear on its parts.  Inflammatory Knee Problems are inflammation that occurs in certain rheumatic diseases, such as:   Rheumatoid arthritis.   Systemic lupus erythematosus.  JOINT BASICS  The point at which two or more bones are connected is called a joint.   In all joints, the bones are kept from grinding against each other by the tissue lining the ends of the bones called cartilage.  Bones are joined to bones by strong, elastic bands of tissue called ligaments.   Tendons are tough cords of tissue that connect muscle to bone.   Muscles work in opposing pairs to bend and straighten  joints. While muscles are not technically part of a joint, they are important because strong muscles help support and protect joints.  WHAT ARE THE PARTS OF THE KNEE? Like any joint, the knee is composed of bones and cartilage, ligaments, tendons, and muscles. BONES AND CARTILAGE The knee joint is the junction of four bones:   The thigh bone or upper leg bone (femur).   The shin bone or larger bone of the lower leg (tibia).   The small bone on the outside of the knee where ligaments attach (fibula).   The knee cap (patella). The patella is 2 to 3 inches wide and 3 to 4 inches long. It sits over the other bones at the front of the knee joint and slides when the leg moves. It protects the knee and gives leverage to muscles.  The ends of the bones in the knee joint are covered with articular cartilage, a tough, elastic material that helps absorb shock and allows the knee joint to move smoothly. Separating the bones of the knee are pads of connective tissue which are called meniscus. The plural is menisci. The menisci are divided into two crescent-shaped discs positioned between the tibia and femur on the outer and inner sides of each knee. The two menisci in each knee act as shock absorbers, cushioning the lower part of the leg from the weight of the rest of the body as well as enhancing stability. MUSCLES There are two groups of muscles at the knee.  The quadriceps muscle are four muscles on the front of the thigh that work to straighten the leg from a bent position.   The hamstring muscles, which bend the leg at the knee, run along the back of the thigh from the hip to just below the knee.  Keeping these muscles strong with exercises such as walking up stairs or riding a stationary bicycle helps support and protect the knee. TENDONS AND LIGAMENTS  The quadriceps tendon connects the quadriceps muscle to the patella and provides the power to extend the leg. The patella is a bone within this  tendon. Four ligaments connect the femur and tibia and give the joint strength and stability:   The medial collateral ligament (MCL) provides stability to the inner (medial) part of the knee.   The lateral collateral ligament (LCL) provides stability to the outer (lateral) part of the knee.   The anterior cruciate ligament (ACL), in the center of the knee, limits rotation and the forward movement of the tibia.   The posterior cruciate ligament (PCL), also in the center of the knee, limits backward movement of the tibia.   Other ligaments are part of the knee capsule. This is the protective, fiber-like structure that wraps around the knee joint. Inside the capsule, the joint is lined with a thin, soft tissue called synovium. This tissue produces the fluid (synovial fluid) which lubricates the joint.  HOW ARE KNEE PROBLEMS DIAGNOSED? Caregivers use several methods to diagnose knee problems:  Medical history--The patient tells the caregiver details about:   Symptoms.   Injuries.   Medical conditions.   Physical examination-- To help the caregiver understand how the knee is working, the patient may be asked to stand, walk or squat. The caregiver, to discover the limits of movement  and the location of pain in the knee, may:   Bend the knee.   Straighten the knee.   Rotate (turn) turn the knee.   Press on the knee to feel for injury.   Diagnostic tests--The caregiver uses one or more stress tests to determine the nature of a knee problem.   X-ray (radiography)--An x-ray beam is passed through the knee to produce a two-dimensional picture of the bones.   Computerized axial tomography (CAT) scan--X-rays are passed through the knee at different angles, detected by a scanner, and analyzed by a computer. This produces a series of clear cross-sectional images ("slices") of the knee tissues on a computer screen. CAT scan images show details of bone structure, show soft tissues such as  ligaments or muscles to a limited degree, can give a three-dimensional view of the knee.   Bone scan (radionuclide scanning)--A very small amount of radioactive material is injected into the patient's bloodstream and detected by a scanner. This test detects blood flow to the bone and cell activity within the bone and can show abnormalities. This may help the caregiver understand what is wrong.   Magnetic resonance imaging (MRI)--Energy from a powerful magnet (rather than x-rays) stimulates knee tissue to produce signals. These signals are detected by a scanner and analyzed by a computer. Like a CAT scan, a computer is used to produce three-dimensional views of the knee during MRI. The MRI provides precise details of ligament, tendon and cartilage structure.   Arthroscopy--The surgeon manipulates a small, lighted optic tube (arthroscope) that has been inserted into the joint through a small incision in the knee. Images of the inside of the knee joint are projected onto a television screen. While the arthroscope is inside the knee joint, removal of loose pieces of bone or cartilage, or the repair of torn ligaments and menisci can be preformed.   Biopsy--The caregiver removes tissue to examine under a microscope.   Aspiration of fluid from the knee--The laboratory will analyze the fluid for cell count, presence of crystals that produce inflammation (as in gout where Uric Acid crystals are the cause of the inflammation) and check for infection.  WHAT IS ARTHRITIS OF THE KNEE?   Arthritis of the knee is most often osteoarthritis. In this disease, the cartilage in the joint gradually wears away. It may be caused by excess stress on the joint from:   Trauma.   Deformity.   Repeated injury.   Excess weight.   It most often affects middle-aged and older people. A young person who develops osteoarthritis may have an inherited form of the disease or may have experienced continuous irritation from an  unrepaired knee injury or other injury.   In rheumatoid arthritis, which can also affect the knees, the joint becomes inflamed and cartilage may be destroyed. Rheumatoid arthritis often affects people at an earlier age than osteoarthritis and often involves multiple joints.   Arthritis can also affect supporting structures such as muscles, tendons, and ligaments.  WHAT ARE SIGNS OF ARTHRITIS OF THE KNEE?  Someone who has arthritis of the knee may experience:   Pain.   Swelling/ fluid on the knee.   A decrease in knee motion.   A common symptom is morning stiffness. This generally improves as the person moves around.   Sometimes the joint locks or clicks. These signs may occur in other knee disorders as well.   The caregiver may confirm the diagnosis by:   Performing a physical examination.   Examining x-rays,  which typically show a loss of joint space.   Blood tests may be helpful for diagnosing rheumatoid arthritis, but other tests may be needed.   Analyzing fluid from the knee joint may be helpful in diagnosing some kinds of arthritis.   The caregiver may use arthroscopy to directly see damage to cartilage, tendons, and ligaments and to confirm a diagnosis. Arthroscopy is usually done only if a repair procedure is to be performed.  WHAT IS TREATMENT FOR ARTHRITIS OF THE KNEE?  Most often osteoarthritis of the knee is treated with pain-reducing medicines, such as:   Nonsteroidal anti-inflammatory drugs (NSAID's)   Exercises to restore joint movement and strengthen the knee.   Losing excess weight can also help people with osteoarthritis.   Rheumatoid arthritis of the knee may require physical therapy and more powerful medicines. In people with severe arthritis of the knee, a seriously damaged joint may need to be replaced with an artificial one.  WHAT IS CHONDROMALACIA?  Chondromalacia refers to softening of the articular cartilage of the knee cap. This disorder occurs  most often in young adults. Instead of gliding smoothly across the lower end of the thigh bone, the knee cap rubs against it, thereby roughening the cartilage underneath the knee cap. The damage may range from a slightly abnormal surface of the cartilage to a surface that has been worn away to the bone. It can be caused by:   Injury.   Overuse.   Misalignment of the patellar tendon.   Muscle weakness (generally the quadriceps).   Chondromalacia related to injury occurs when a blow to the knee cap tears off either a small piece of cartilage or a large fragment containing a piece of bone.  WHAT ARE SYMPTOMS OF CHONDROMALACIA?  The most frequent symptom is a dull pain around or under the knee cap. This pain worsens when walking down stairs, or hills or sitting with the knee bent for long periods of time.   A person may also feel pain when climbing stairs or when the knee bears weight as it straightens.   The disorder is common in:   Runners.   Skiers.   Cyclists.   Soccer players.   A patient's description of symptoms, the physical exam, and a follow-up x-ray usually help the caregiver make a diagnosis.   Although arthroscopy can confirm the diagnosis. It is not used unless the condition requires extensive treatment.  WHAT IS TREATMENT FOR CHONDROMALACIA?  Many caregivers recommend that patients with chondromalacia perform low-impact exercises. The knee must not bend more than 90 degrees. This includes:   Swimming.   Riding a stationary bicycle.   Using a cross-country ski machine.   Electrical stimulation may also be used to strengthen the muscles.   If these treatments do not improve the condition, the caregiver may perform arthroscopic. Goals of this surgery include smoothing the surface of the cartilage and "washing out" the cartilage fragments that cause the joint to catch during bending and straightening.   In more severe cases, surgery may be necessary to:   Correct  the alignment of the knee cap.   Decrease the pressure on the undersurface of the patella.   Relieve friction with the cartilage.   Reposition parts that are out of alignment.  WHAT CAUSES INJURIES TO THE MENISCUS? The meniscus is easily injured by the force of rotating the knee while bearing weight. A partial or total tear may occur when a person quickly twists or rotates the upper leg while  the foot stays still. For example, when dribbling a basketball around an opponent or turning to hit a tennis ball. If the tear is tiny, the meniscus stays connected to the front and back of the knee. If the tear is large, the meniscus may be left in an abnormally mobile position which produces instability. The seriousness of a tear depends on its location and extent. WHAT ARE SYMPTOMS OF INJURIES TO THE MENISCUS?  Pain, particularly when the knee is straightened.   If the pain is mild, the patient may continue with normal activity.   Severe pain may occur if a fragment of the meniscus catches between the femur and the tibia.   Swelling may occur soon after injury if blood vessels are disrupted. Swelling may occur several hours later if the joint fills with fluid produced by the joint lining (synovium) as a result of inflammation. If the synovium is injured, it may become inflamed and produce fluid. This makes the knee swell.   Sometimes, an injury that occurred in the past but was not treated becomes painful months or years later.   After any injury, the knee may click, lock, feel weak, or give way without warning.   Although symptoms of meniscal injury may disappear on their own (particularly with a stable meniscal tear), they frequently persist or return and require treatment.  HOW IS INJURY TO THE MENISCUS DIAGNOSED?  The caregiver will listen to the patient's description of the pain and swelling. The caregiver will perform a physical examination and take x-rays of the knee. The examination may  include a test in which the caregiver bends the leg, and then rotates the leg outward and inward while extending it. Pain along the joint line or an audible click suggests a meniscal tear.   An MRI may be done.   Occasionally, the caregiver may use arthroscopy without obtaining the MRI to diagnose and treat a meniscal tear.  WHAT IS TREATMENT FOR INJURY TO THE MENISCUS?  The caregiver may recommend a muscle-strengthening program if:   The tear is minor.   The pain and symptoms are improving.   Exercises for meniscal problems are best started with guidance from a caregiver and physical therapist or athletic trainer. The therapist will make sure that the patient does the exercises properly and without risking new or repeat injury. The following exercises after injury to the meniscus are designed to build up the quadriceps and hamstring muscles and increase flexibility and strength.   Warming up the joint by riding a stationary bicycle, then straightening and raising the leg (but not straightening it too much).   Extending the leg while sitting (a weight may be worn on the ankle for this exercise).   Raising the leg while lying on the stomach.   Exercising in a pool (walking as fast as possible in chest-deep water, performing small flutter kicks while holding onto the side of the pool, and raising each leg to 90 in chest-deep water while pressing the back against the side of the pool).   If the tear is more extensive, the caregiver may perform arthroscopic with or without open surgery to see the extent of injury and to repair the tear. The caregiver can sew the meniscus back in place if the patient is relatively young, if the injury is in an area with a good blood supply, and if the ligaments are intact. Most young athletes are able to return to active sports after meniscus repair.   If the patient  is elderly or the tear is in an area with a poor blood supply, the caregiver may trim a small  portion of the meniscus to even the surface. In rare cases, the caregiver removes the entire meniscus. Osteoarthritis is more likely to develop in the knee if the entire meniscus is removed.   Recovery after surgical repair takes several weeks to months. Activity after surgery is slightly more restricted than when the meniscus is partially removed. However, putting weight on the joint actually helps recovery. Regardless of the form of surgery, rehabilitation usually includes:   Walking.   Bending the legs.   Doing exercises that stretch and build up leg muscles.   The best results of treatment for meniscal injury are obtained in people who:   Do not have articular cartilage changes.   Have an intact ACL.  LIGAMENT INJURIES WHAT ARE THE CAUSES OF ANTERIOR AND POSTERIOR CRUCIATE LIGAMENT INJURIES?  Injury to the cruciate ligaments is sometimes referred to as a "sprain".   The ACL is most often stretched or torn (or both) by a sudden twisting or pushing the ACL beyond its normal range. For example, when the feet are planted one way and the knee rotates in the opposite direction.   The PCL is most often injured by a direct impact, such as in an automobile accident or football tackle.  WHAT ARE SYMPTOMS?  Injury to a cruciate ligament may not cause pain. Symptoms may include:   A popping sound   Buckling when trying to stand on the leg.   The caregiver will perform several physical exam tests. These tests are to see whether the parts of the knee stay in proper position when pressure is applied in different directions.   A thorough examination is essential. An MRI is very accurate in detecting a complete tear. Arthroscopy may be the only reliable means of detecting a partial one.  TREATMENT  For an incomplete tear, the caregiver may recommend that the patient begin an exercise program to strengthen surrounding muscles.   The caregiver may also prescribe a brace to protect the knee  during activity.   For a completely torn ACL in an active athlete and motivated person, the caregiver is likely to recommend surgery. The surgeon may reconstruct the torn ligament by using:   A piece (graft) of healthy ligament from the patient (autograft)   A piece of ligament from a tissue bank (allograft). One of the most important elements in a patient's successful recovery after cruciate ligament surgery is a 4- to 45-month exercise program. This program may involve using special exercise equipment at a rehabilitation or sports center. Successful surgery special exercises will allow the patient to return to a normal, active lifestyle.  MEDIAL AND LATERAL COLLATERAL LIGAMENT INJURIES WHAT IS THE MOST COMMON CAUSE OF MEDIAL AND LATERAL COLLATERAL LIGAMENT INJURIES? The MCL is more commonly injured than the LCL. The cause is most often a blow to the outer side of the knee. This injury stretches and tears the ligament on the inner side of the knee. Such blows frequently occur in contact sports like football or hockey. SYMPTOMS AND DIAGNOSIS  When injury to the MCL occurs, a person may feel a pop and the knee may buckle sideways.   Pain and swelling are common.   A thorough exam is needed to determine the kind and extent of the injury.   To diagnose a collateral ligament injury, the caregiver exerts pressure on the side of the knee to  determine the degree of pain and the looseness of the joint.   An MRI is helpful in diagnosing injuries to these ligaments.  TREATMENT  Most sprains of the collateral ligaments will heal if the patient follows a prescribed exercise program.   In addition to exercise, the caregiver may recommend ice packs to reduce pain and swelling and a small sleeve-type brace to protect and stabilize the knee.   A sprain may take 4 to 6 weeks to heal.   A patient with a severely sprained or torn collateral ligament may also have a torn ACL. This usually requires surgical  repair.  TENDON INJURIES AND DISORDERS WHAT CAUSES TENDINITIS AND RUPTURED TENDONS?  Knee tendon injuries range from tendinitis to a torn (ruptured) tendon.   If a person overuses a tendon during certain activities such as dancing, cycling, or running, the tendon stretches like a worn-out rubber band and becomes inflamed.   Also, trying to break a fall may cause the quadriceps muscles to contract and tear the quadriceps tendon above the patella or the patellar tendon below the patella. This type of injury is most likely to happen in older people.   Tendinitis of the patellar tendon is sometimes called jumper's knee because in sports that require jumping, such as basketball or volleyball, the muscle contraction and force of hitting the ground after a jump strain the tendon.   After repeated stress, the tendon may become inflamed or tear.  SYMPTOMS AND DIAGNOSIS  People with tendinitis often have tenderness at the point where the patellar tendon meets the bone. In addition, they may feel pain during running, fast walking, or jumping.   A complete rupture of the quadriceps or patellar tendon is painful. It also makes it difficult for a person to bend, extend, or lift the leg against gravity.   If there is not much swelling, the caregiver may be able to feel a defect in the tendon near the tear during a physical examination.   An x-ray will show that the patella is lower than normal in a quadriceps tendon tear and higher than normal in a patellar tendon tear. The caregiver may use an MRI to confirm a partial or total tear.  TREATMENT  Initially, the caregiver may ask a patient with tendinitis to rest, elevate, and apply ice to the knee and to take medicines to relieve pain and decrease inflammation and swelling.   If the quadriceps or patellar tendon is completely ruptured, a surgeon will reattach the ends. After surgery, the patient will wear a cast or brace for 3 to 6 weeks and use crutches.    For a partial tear, the caregiver might apply a cast or an extension knee brace without performing surgery.   Rehabilitating a partial or complete tear of a tendon requires an exercise program that is similar to but less forceful than that used for ligament injuries. The goals of exercise are to restore the ability to bend and straighten the knee and to strengthen the leg to prevent repeat injury. A rehabilitation program may last 4 to 6 months. A patient can return to many activities before then.  OSGOOD-SCHLATTER DISEASE WHAT CAUSES OSGOOD-SCHLATTER DISEASE?  Osgood-Schlatter disease is caused by repetitive stress or tension on part of the growth area of the upper tibia (the apophysis). Symptoms included inflammation of the patellar tendon and surrounding soft tissues at the point where the tendon attaches to the tibia.   The disease may also be associated with an injury in  which the tendon is stretched so much that it tears away from the tibia and takes a fragment of bone with it.   The disease generally affects active young people. Particularly boys between the ages of 61 and 35, who play games or sports that include frequent running and jumping and who have open growth plates.  SYMPTOMS AND DIAGNOSIS  People with this disease experience pain just below the knee joint. This pain usually worsens with activity and is relieved by rest.   The bony bump that is particularly painful when pressed may increase in size at the upper edge of the tibia (below the knee cap).   Usually motion of the knee is not affected.   Pain may last a few months and may come back with periods of high activity until the child's growth is completed.   Osgood-Schlatter disease is most often diagnosed by the symptoms and the physical exam. An x-ray may be normal, or show an injury. An x-ray, more typically, will show that the growth area is fragmented.  TREATMENT  Usually, the disease goes away without aggressive  treatment.   Applying ice to the knee when pain begins helps relieve inflammation. Applying ice is sometimes used along with stretching and strengthening exercises.   The caregiver may advise the patient to limit participation in vigorous sports. Children who wish to continue less stressful sports activities may need to wear knee pads for protection and apply ice to the knee after activity. If there is a great deal of pain, sports activities may be limited until discomfort becomes tolerable.  ILIOTIBIAL BAND SYNDROME WHAT CAUSES ILIOTIBIAL BAND SYNDROME? This is an overuse condition in which inflammation results when a band of a tendon rubs over the outer bone of the knee. Although iliotibial band syndrome may be caused by direct injury to the knee, it is most often caused by the stress of long-term overuse. SYMPTOMS AND DIAGNOSIS  A person with this syndrome feels an ache or burning sensation at the outside of the knee during activity. Pain may be localized at the outside of the knee or radiate up the side of the thigh.   A person may also feel a snap when the knee is bent and then straightened.   Swelling may be absent and knee motion is normal.   The diagnosis of this disorder is typically based on the symptoms. Symptoms include pain at the outer side of the knee. Other problems with similar symptoms must also be excluded.  TREATMENT  Usually, the problem disappears if the person reduces activity and performs stretching exercises followed by muscle-strengthening exercises.   In rare cases when the syndrome does not disappear, surgery may be necessary to split the tendon so it is not stretched too tightly over the bone.  OTHER KNEE INJURIES WHAT IS OSTEOCHONDRITIS DISSECANS?  Osteochondritis dissecans results from a loss of the blood supply to an area of bone at the joint surface and usually involves the knee. The affected bone and its covering of cartilage gradually loosen and cause pain.    This problem usually arises in an active adolescent or young adult. It may be due to a slight blockage of a small artery or to an unrecognized injury or tiny fracture that damages the overlying cartilage.   Lack of a blood supply can cause bone to break down (avascular necrosis).   The involvement of several joints or the appearance of the condition in several family members may indicate that the disorder is  inherited.   A person with this condition may eventually develop osteoarthritis.  SYMPTOMS AND DIAGNOSIS  If normal healing does not occur, cartilage separates from the diseased bone and a fragment breaks loose into the knee joint. This causes weakness, sharp pain, and locking of the joint.   An x-ray, MRI, or arthroscopy can determine the condition of the cartilage and can be used to diagnose osteochondritis dissecans.  TREATMENT  If cartilage fragments have not broken loose, a surgeon may fix the cartilage and underlying bone in place with pins or screws. These pins or screws are sunk into the cartilage to stimulate a new blood supply.   If fragments are loose, the surgeon may scrape down the cavity to reach fresh bone and add a bone graft and fix the fragments in position. Fragments that cannot be mended are removed, and the cavity is drilled or scraped to stimulate new cartilage growth.   Research is being done to assess the use of cartilage cell implants and other tissue transplants to treat this disorder.  WHAT IS PLICA SYNDROME?  Plica syndrome occurs when plicae (bands of synovial tissue) are irritated by overuse or injury.   Synovial plicae are the remains of tissue pouches found in the early stages of fetal development. As the fetus develops, these pouches normally combine to form one large synovial cavity. If this process is incomplete, plicae remain as folds or bands of synovial tissue within the knee.   Injury, chronic overuse, or inflammatory conditions are associated  with this syndrome.  SYMPTOMS AND DIAGNOSIS  People with this syndrome are likely to experience pain and swelling, a clicking sensation, and locking and weakness of the knee.   Because the symptoms are similar to those of some other knee problems, plica syndrome is often misdiagnosed. Diagnosis usually depends on excluding other conditions that cause similar symptoms.  TREATMENT  The goal of treatment is to reduce inflammation of the synovium and thickening of the plicae.   The caregiver usually prescribes medicine to reduce inflammation.   The patient is also advised to reduce activity, apply ice and an elastic bandage to the knee, and do strengthening exercises.   A cortisone injection into the plica folds helps about half of those treated.   If treatment fails to relieve symptoms within 3 months, the caregiver may recommend arthroscopic or open surgery to remove the plicae.  WHAT KINDS OF CAREGIVERS TREAT KNEE PROBLEMS?  Extensive injuries and diseases of the knees are usually treated by an orthopedic surgeon, a doctor who has been trained in the nonsurgical and surgical treatment of bones, joints, and soft tissues such as ligaments, tendons, and muscles.   Patients seeking nonsurgical treatment of arthritis of the knee may also consult a rheumatologist. This is a caregiver specializing in the diagnosis and treatment of arthritis and related disorders.  HOW CAN PEOPLE PREVENT KNEE PROBLEMS? Some knee problems cannot be foreseen or prevented. However, a person can prevent many knee problems by following these suggestions:  Before exercising or playing sports, warm up by walking or riding a stationary bicycle, and then do stretches. Stretching the muscles in the front of the thigh (quadriceps) and back of the thigh (hamstrings) reduces tension on the tendons. Stretching also relieves pressure on the knee during activity.   Strengthen the leg muscles by doing specific exercises (for  example, by walking up stairs or hills, or by riding a stationary bicycle). A supervised workout with weights is another way to strengthen the leg  muscles that support the knee.   Avoid sudden changes in the intensity of exercise. Increase the force or duration of activity gradually.   Wear shoes that both fit properly and are in good condition. Good shoes will help maintain balance and leg alignment when walking or running. Knee problems can be caused by flat feet or feet that roll inward. People can often reduce some of these problems by wearing special shoe inserts (orthotics).   Maintain a healthy weight to reduce stress on the knee. Obesity increases the risk of degenerative (wearing) conditions such as osteoarthritis of the knee.  WHAT TYPES OF EXERCISE ARE MOST SUITABLE FOR SOMEONE WITH KNEE PROBLEMS? Three types of exercise are best for people with arthritis:  Range-of-motion exercises help maintain normal joint movement and relieve stiffness. This type of exercise helps maintain or increase flexibility.   Strengthening exercises help maintain or increase muscle strength. Strong muscles help support and protect joints affected by arthritis.   Aerobic or endurance exercises improve function of the heart and circulation and help control weight. Weight control can be important to people who have arthritis because extra weight puts pressure on many joints. Some studies show that aerobic exercise can reduce inflammation in some joints.  WHERE CAN PEOPLE FIND MORE INFORMATION ABOUT KNEE PROBLEMS? [employer]th[employer] and Musculoskeletal and Skin: www.niams.SelfMillionaire.nl American Academy of Orthopedic Surgeons: www.aaos.org [employer] of Rheumatology: www.rheumatology.org American Physical Therapy Association: www.apta.org Arthritis Foundation: www.arthritis.org Document Released: 11/30/2003 Document Revised: 08/09/2011 Document Reviewed: 03/17/2009 [facility] Patient Information  2012 [place], [place].

## 2012-02-04 NOTE — ED Provider Notes (Signed)
History     CSN: [id number]  Arrival date & time 02/04/12  1128   First MD Initiated Contact with Patient 02/04/12 1133      Chief Complaint  Patient presents with  . Knee Pain    (Consider location/radiation/quality/duration/timing/severity/associated sxs/prior treatment) Patient is a 61 y.o. female presenting with knee pain. The history is provided by the patient.  Knee Pain This is a new problem. The current episode started more than 1 week ago. The problem occurs daily. The problem has not changed since onset.Pertinent negatives include no chest pain, no abdominal pain, no headaches and no shortness of breath. The symptoms are aggravated by bending and twisting (standing). The symptoms are relieved by nothing. Treatments tried: NSAIDS. The treatment provided no relief.    History reviewed. No pertinent past medical history.  Past Surgical History  Procedure Date  . Carpal tunnel release   . Olecranon bursectomy   . Tonsillectomy     History reviewed. No pertinent family history.  History  Substance Use Topics  . Smoking status: Not on file  . Smokeless tobacco: Not on file  . Alcohol Use:     OB History    Grav Para Term Preterm Abortions TAB SAB Ect Mult Living                  Review of Systems  Respiratory: Negative for shortness of breath.   Cardiovascular: Negative for chest pain.  Gastrointestinal: Negative for abdominal pain.  Neurological: Negative for headaches.  All other systems reviewed and are negative.    Allergies  Review of patient's allergies indicates no known allergies.  Home Medications  No current outpatient prescriptions on file.  BP 126/81  Pulse 76  Temp(Src) 97.6 F (36.4 C) (Oral)  Resp 17  SpO2 100%  Physical Exam  Constitutional: She is oriented to person, place, and time. She appears well-developed and well-nourished.  HENT:  Head: Normocephalic.  Musculoskeletal: She exhibits tenderness. She exhibits no edema.      Left knee: She exhibits swelling and effusion. She exhibits no erythema, no LCL laxity, normal patellar mobility and no MCL laxity. tenderness found. Medial joint line tenderness noted. No MCL, no LCL and no patellar tendon tenderness noted.  Neurological: She is alert and oriented to person, place, and time.  Skin: Skin is warm and dry.  Psychiatric: She has a normal mood and affect. Her behavior is normal.    ED Course  Procedures (including critical care time)  Labs Reviewed - No data to display Dg Knee Complete 4 Views Left  02/04/2012  *RADIOLOGY REPORT*  Clinical Data: Medial left knee pain over the past 2 weeks.  No known injuries.  LEFT KNEE - COMPLETE 4+ VIEW 02/03/2002:  Comparison: None.  Findings: Small cortical defect involving the posterior medial cortex of the distal femur, just above the medial condyle.  Small calcified loose bodies suspected within the joint.  No evidence of acute or subacute fracture or dislocation.  Well-preserved bone mineral density.  Joint spaces well preserved.  No visible joint effusion.  IMPRESSION: Small cortical defect involving the posteromedial cortex of the distal femur, above the femoral condyle, likely benign.  Small calcified loose bodies within the posterior joint.  No acute or subacute osseous abnormality.  Original Report Authenticated By: Arnell Sieving, M.D.   R tibial contusion Bursitis /Tendonitis   MDM          Hassan Rowan, MD 02/04/12 2126

## 2012-02-26 ENCOUNTER — Other Ambulatory Visit: Payer: Self-pay | Admitting: Obstetrics and Gynecology

## 2012-02-26 DIAGNOSIS — Z1231 Encounter for screening mammogram for malignant neoplasm of breast: Secondary | ICD-10-CM

## 2012-03-15 ENCOUNTER — Ambulatory Visit
Admission: RE | Admit: 2012-03-15 | Discharge: 2012-03-15 | Disposition: A | Discharge status: Home / Self Care | Payer: Federal, State, Local not specified - PPO | Source: Ambulatory Visit | Attending: Obstetrics and Gynecology | Admitting: Obstetrics and Gynecology

## 2012-03-15 DIAGNOSIS — Z1231 Encounter for screening mammogram for malignant neoplasm of breast: Secondary | ICD-10-CM

## 2013-02-12 ENCOUNTER — Other Ambulatory Visit: Payer: Self-pay

## 2013-03-21 ENCOUNTER — Ambulatory Visit
Admission: RE | Admit: 2013-03-21 | Discharge: 2013-03-21 | Disposition: A | Discharge status: Home / Self Care | Source: Ambulatory Visit

## 2013-03-21 DIAGNOSIS — Z1231 Encounter for screening mammogram for malignant neoplasm of breast: Secondary | ICD-10-CM

## 2017-12-14 NOTE — H&P (Signed)
TOTAL KNEE ADMISSION H&P  Patient is being admitted for bilateral total knee arthroplasty.  Subjective:  Chief Complaint:   Bilateral knee primary OA / pain  HPI: [name], 67 y.o. female, has a history of pain and functional disability in the bilaterally knee due to arthritis and has failed non-surgical conservative treatments for greater than 12 weeks to include NSAID's and/or analgesics, corticosteriod injections, viscosupplementation injections and activity modification.  Onset of symptoms was gradual, starting years ago with gradually worsening course since that time. The patient noted prior procedures on the knee to include  bursectomy on the right knee(s).  Patient currently rates pain in the bilaterally knee(s) at 7 out of 10 with activity. Patient has worsening of pain with activity and weight bearing, pain that interferes with activities of daily living, pain with passive range of motion, crepitus and joint swelling.  Patient has evidence of periarticular osteophytes and joint space narrowing by imaging studies.  There is no active infection.   Risks, benefits and expectations were discussed with the patient.  Risks including but not limited to the risk of anesthesia, blood clots, nerve damage, blood vessel damage, failure of the prosthesis, infection and up to and including death.  Patient understand the risks, benefits and expectations and wishes to proceed with surgery.   PCP: Patient, No Pcp Per  D/C Plans:       Home   Post-op Meds:       No Rx given   Tranexamic Acid:      To be given - IV   Decadron:      Is to be given  FYI:     Xarelto then ASA  Oxycodone  DME:   Pt already has equipment   PT:   HHPT then OPPT (rx given)    Past Medical History:  Diagnosis Date  . Arthritis   . Headache    hx of     Past Surgical History:  Procedure Laterality Date  . APPENDECTOMY    . CARPAL TUNNEL RELEASE    . EYE SURGERY     lasik eye   . HERNIA REPAIR    .  JOINT REPLACEMENT     bilateral tka   12-31-17 Dr. Charlann Boxer  . OLECRANON BURSECTOMY    . TONSILLECTOMY    . TUBAL LIGATION      No current facility-administered medications for this encounter.    Current Outpatient Medications  Medication Sig Dispense Refill Last Dose  . aspirin EC 81 MG tablet Take 81 mg by mouth daily.     . Biotin 10 MG TABS Take 10 mg by mouth daily.     . bisacodyl (DULCOLAX) 5 MG EC tablet Take 5 mg by mouth daily.     . Calcium Carbonate-Vit D-Min (CALCIUM 1200 PO) Take 1,200 mg by mouth daily.     . Cholecalciferol (VITAMIN D3) 1000 units CAPS Take 1,000 Units by mouth daily.     . Cinnamon 500 MG TABS Take 1,000 mg by mouth daily.     . ferrous sulfate 325 (65 FE) MG tablet Take 325 mg by mouth daily with breakfast.     . Glucosamine HCl (GLUCOSAMINE PO) Take 1 tablet by mouth daily.     Marland Kitchen ibuprofen (ADVIL,MOTRIN) 200 MG tablet Take 800 mg by mouth every 6 (six) hours as needed for headache or moderate pain.     . Magnesium 250 MG TABS Take 250 mg by mouth daily.     Marland Kitchen  meloxicam (MOBIC) 7.5 MG tablet Take 7.5 mg by mouth daily as needed for pain.     . Multiple Vitamins-Minerals (MULTIVITAMIN PO) Take 1 tablet by mouth daily.     . naproxen sodium (ALEVE) 220 MG tablet Take 220 mg by mouth 2 (two) times daily as needed (for pain or headache).     . pyridOXINE (VITAMIN B-6) 100 MG tablet Take 100 mg by mouth daily.     . Turmeric 500 MG TABS Take 500 mg by mouth daily.      No Known Allergies   Social History   Tobacco Use  . Smoking status: Never Smoker  . Smokeless tobacco: Never Used  Substance Use Topics  . Alcohol use: Yes    Comment: occasional wine       Review of Systems  Constitutional: Negative.   HENT: Negative.   Eyes: Negative.   Respiratory: Negative.   Cardiovascular: Negative.   Gastrointestinal: Positive for constipation.  Genitourinary: Negative.   Musculoskeletal: Positive for joint pain.  Skin: Negative.   Neurological: Positive  for headaches.  Endo/Heme/Allergies: Negative.   Psychiatric/Behavioral: Negative.     Objective:  Physical Exam  Constitutional: She is oriented to person, place, and time. She appears well-developed.  HENT:  Head: Normocephalic.  Eyes: Pupils are equal, round, and reactive to light.  Neck: Neck supple. No JVD present. No tracheal deviation present. No thyromegaly present.  Cardiovascular: Normal rate, regular rhythm and intact distal pulses.  Respiratory: Effort normal and breath sounds normal. No respiratory distress. She has no wheezes.  GI: Soft. There is no tenderness. There is no guarding.  Musculoskeletal:       Right knee: She exhibits decreased range of motion, swelling and bony tenderness. She exhibits no ecchymosis, no deformity, no laceration and no erythema. Tenderness found.       Left knee: She exhibits decreased range of motion, swelling and bony tenderness. She exhibits no ecchymosis, no deformity, no laceration and no erythema. Tenderness found.  Lymphadenopathy:    She has no cervical adenopathy.  Neurological: She is alert and oriented to person, place, and time.  Skin: Skin is warm and dry.  Psychiatric: She has a normal mood and affect.       Imaging Review Plain radiographs demonstrate severe degenerative joint disease of the bilaterally knee(s).  The bone quality appears to be good for age and reported activity level.  Assessment/Plan:  End stage arthritis, bilateral knee   The patient history, physical examination, clinical judgment of the provider and imaging studies are consistent with end stage degenerative joint disease of the bilateral knees and total knee arthroplasty is deemed medically necessary. The treatment options including medical management, injection therapy arthroscopy and arthroplasty were discussed at length. The risks and benefits of total knee arthroplasty were presented and reviewed. The risks due to aseptic loosening, infection,  stiffness, patella tracking problems, thromboembolic complications and other imponderables were discussed. The patient acknowledged the explanation, agreed to proceed with the plan and consent was signed. Patient is being admitted for inpatient treatment for surgery, pain control, PT, OT, prophylactic antibiotics, VTE prophylaxis, progressive ambulation and ADL's and discharge planning. The patient is planning to be discharged home with home health services.     Anastasio AuerbachMatthew S. Rosanne Wohlfarth   PA-C  12/25/2017, 12:39 PM

## 2017-12-24 NOTE — Patient Instructions (Addendum)
[name]  12/24/2017   Your procedure is scheduled on: 12-31-17  Report to [facility] [address]  Entrance              Report to admitting at   1115 AM   Call this number if you have problems the morning of surgery [phone]    Remember: Do not eat food:After Midnight.  YOU MAY HAVE CLEAR LIQUIDS UNTIL 0745 THEN NOTHING BY MOUTH     Take these medicines the morning of surgery with A SIP OF WATER: NONE                                You may not have any metal on your body including hair pins and              piercings  Do not wear jewelry, make-up, lotions, powders or perfumes, deodorant             Do not wear nail polish.  Do not shave  48 hours prior to surgery.              Do not bring valuables to the hospital. [facility] IS NOT             RESPONSIBLE   FOR VALUABLES.  Contacts, dentures or bridgework may not be worn into surgery.  Leave suitcase in the car. After surgery it may be brought to your room.               Please read over the following fact sheets you were given: _____________________________________________________________________          [facility] - Preparing for Surgery Before surgery, you can play an important role.  Because skin is not sterile, your skin needs to be as free of germs as possible.  You can reduce the number of germs on your skin by washing with CHG (chlorahexidine gluconate) soap before surgery.  CHG is an antiseptic cleaner which kills germs and bonds with the skin to continue killing germs even after washing. Please DO NOT use if you have an allergy to CHG or antibacterial soaps.  If your skin becomes reddened/irritated stop using the CHG and inform your nurse when you arrive at Short Stay. Do not shave (including legs and underarms) for at least 48 hours prior to the first CHG shower.  You may shave your face/neck. Please follow these instructions carefully:  1.  Shower with CHG Soap the night before surgery  and the  morning of Surgery.  2.  If you choose to wash your hair, wash your hair first as usual with your  normal  shampoo.  3.  After you shampoo, rinse your hair and body thoroughly to remove the  shampoo.                           4.  Use CHG as you would any other liquid soap.  You can apply chg directly  to the skin and wash                       Gently with a scrungie or clean washcloth.  5.  Apply the CHG Soap to your body ONLY FROM THE NECK DOWN.   Do not use on face/ open  Wound or open sores. Avoid contact with eyes, ears mouth and genitals (private parts).                       Wash face,  Genitals (private parts) with your normal soap.             6.  Wash thoroughly, paying special attention to the area where your surgery  will be performed.  7.  Thoroughly rinse your body with warm water from the neck down.  8.  DO NOT shower/wash with your normal soap after using and rinsing off  the CHG Soap.                9.  Pat yourself dry with a clean towel.            10.  Wear clean pajamas.            11.  Place clean sheets on your bed the night of your first shower and do not  sleep with pets. Day of Surgery : Do not apply any lotions/deodorants the morning of surgery.  Please wear clean clothes to the hospital/surgery center.  FAILURE TO FOLLOW THESE INSTRUCTIONS MAY RESULT IN THE CANCELLATION OF YOUR SURGERY PATIENT SIGNATURE_________________________________  NURSE SIGNATURE__________________________________  ________________________________________________________________________    CLEAR LIQUID DIET  TILL 0745 AM THEN NOTHING BY MOUTH   Foods Allowed                                                                     Foods Excluded  Coffee and tea, regular and decaf                             liquids that you cannot  Plain Jell-O in any flavor                                             see through such as: Fruit ices (not with fruit pulp)                                      milk, soups, orange juice  Iced Popsicles                                    All solid food Carbonated beverages, regular and diet                                    Cranberry, grape and apple juices Sports drinks like Gatorade Lightly seasoned clear broth or consume(fat free) Sugar, honey syrup  Sample Menu Breakfast                                Lunch  Supper Cranberry juice                    Beef broth                            Chicken broth Jell-O                                     Grape juice                           Apple juice Coffee or tea                        Jell-O                                      Popsicle                                                Coffee or tea                        Coffee or tea  _____________________________________________________________________   WHAT IS A BLOOD TRANSFUSION? Blood Transfusion Information  A transfusion is the replacement of blood or some of its parts. Blood is made up of multiple cells which provide different functions.  Red blood cells carry oxygen and are used for blood loss replacement.  White blood cells fight against infection.  Platelets control bleeding.  Plasma helps clot blood.  Other blood products are available for specialized needs, such as hemophilia or other clotting disorders. BEFORE THE TRANSFUSION  Who gives blood for transfusions?   Healthy volunteers who are fully evaluated to make sure their blood is safe. This is blood bank blood. Transfusion therapy is the safest it has ever been in the practice of medicine. Before blood is taken from a donor, a complete history is taken to make sure that person has no history of diseases nor engages in risky social behavior (examples are intravenous drug use or sexual activity with multiple partners). The donor's travel history is screened to minimize risk of transmitting infections, such as  malaria. The donated blood is tested for signs of infectious diseases, such as HIV and hepatitis. The blood is then tested to be sure it is compatible with you in order to minimize the chance of a transfusion reaction. If you or a relative donates blood, this is often done in anticipation of surgery and is not appropriate for emergency situations. It takes many days to process the donated blood. RISKS AND COMPLICATIONS Although transfusion therapy is very safe and saves many lives, the main dangers of transfusion include:   Getting an infectious disease.  Developing a transfusion reaction. This is an allergic reaction to something in the blood you were given. Every precaution is taken to prevent this. The decision to have a blood transfusion has been considered carefully by your caregiver before blood is given. Blood is not given unless the benefits outweigh the risks. AFTER THE TRANSFUSION  Right after receiving a blood transfusion, you will usually  feel much better and more energetic. This is especially true if your red blood cells have gotten low (anemic). The transfusion raises the level of the red blood cells which carry oxygen, and this usually causes an energy increase.  The nurse administering the transfusion will monitor you carefully for complications. HOME CARE INSTRUCTIONS  No special instructions are needed after a transfusion. You may find your energy is better. Speak with your caregiver about any limitations on activity for underlying diseases you may have. SEEK MEDICAL CARE IF:   Your condition is not improving after your transfusion.  You develop redness or irritation at the intravenous (IV) site. SEEK IMMEDIATE MEDICAL CARE IF:  Any of the following symptoms occur over the next 12 hours:  Shaking chills.  You have a temperature by mouth above 102 F (38.9 C), not controlled by medicine.  Chest, back, or muscle pain.  People around you feel you are not acting correctly  or are confused.  Shortness of breath or difficulty breathing.  Dizziness and fainting.  You get a rash or develop hives.  You have a decrease in urine output.  Your urine turns a dark color or changes to pink, red, or brown. Any of the following symptoms occur over the next 10 days:  You have a temperature by mouth above 102 F (38.9 C), not controlled by medicine.  Shortness of breath.  Weakness after normal activity.  The white part of the eye turns yellow (jaundice).  You have a decrease in the amount of urine or are urinating less often.  Your urine turns a dark color or changes to pink, red, or brown. Document Released: 11/24/2000 Document Revised: 02/19/2012 Document Reviewed: 07/13/2008 [employer] Patient Information 2014 [place], [place].  _______________________________________________________________________  Incentive Spirometer  An incentive spirometer is a tool that can help keep your lungs clear and active. This tool measures how well you are filling your lungs with each breath. Taking long deep breaths may help reverse or decrease the chance of developing breathing (pulmonary) problems (especially infection) following:  A long period of time when you are unable to move or be active. BEFORE THE PROCEDURE   If the spirometer includes an indicator to show your best effort, your nurse or respiratory therapist will set it to a desired goal.  If possible, sit up straight or lean slightly forward. Try not to slouch.  Hold the incentive spirometer in an upright position. INSTRUCTIONS FOR USE  1. Sit on the edge of your bed if possible, or sit up as far as you can in bed or on a chair. 2. Hold the incentive spirometer in an upright position. 3. Breathe out normally. 4. Place the mouthpiece in your mouth and seal your lips tightly around it. 5. Breathe in slowly and as deeply as possible, raising the piston or the ball toward the top of the column. 6. Hold your  breath for 3-5 seconds or for as long as possible. Allow the piston or ball to fall to the bottom of the column. 7. Remove the mouthpiece from your mouth and breathe out normally. 8. Rest for a few seconds and repeat Steps 1 through 7 at least 10 times every 1-2 hours when you are awake. Take your time and take a few normal breaths between deep breaths. 9. The spirometer may include an indicator to show your best effort. Use the indicator as a goal to work toward during each repetition. 10. After each set of 10 deep breaths, practice coughing to  be sure your lungs are clear. If you have an incision (the cut made at the time of surgery), support your incision when coughing by placing a pillow or rolled up towels firmly against it. Once you are able to get out of bed, walk around indoors and cough well. You may stop using the incentive spirometer when instructed by your caregiver.  RISKS AND COMPLICATIONS  Take your time so you do not get dizzy or light-headed.  If you are in pain, you may need to take or ask for pain medication before doing incentive spirometry. It is harder to take a deep breath if you are having pain. AFTER USE  Rest and breathe slowly and easily.  It can be helpful to keep track of a log of your progress. Your caregiver can provide you with a simple table to help with this. If you are using the spirometer at home, follow these instructions: SEEK MEDICAL CARE IF:   You are having difficultly using the spirometer.  You have trouble using the spirometer as often as instructed.  Your pain medication is not giving enough relief while using the spirometer.  You develop fever of 100.5 F (38.1 C) or higher. SEEK IMMEDIATE MEDICAL CARE IF:   You cough up bloody sputum that had not been present before.  You develop fever of 102 F (38.9 C) or greater.  You develop worsening pain at or near the incision site. MAKE SURE YOU:   Understand these instructions.  Will watch  your condition.  Will get help right away if you are not doing well or get worse. Document Released: 04/09/2007 Document Revised: 02/19/2012 Document Reviewed: 06/10/2007 [facility] Patient Information 2014 [place], [place].   ________________________________________________________________________

## 2017-12-25 ENCOUNTER — Encounter (HOSPITAL_COMMUNITY)
Admission: RE | Admit: 2017-12-25 | Discharge: 2017-12-25 | Disposition: A | Discharge status: Home / Self Care | Payer: Non-veteran care | Source: Ambulatory Visit | Attending: Orthopedic Surgery | Admitting: Orthopedic Surgery

## 2017-12-25 ENCOUNTER — Encounter (HOSPITAL_COMMUNITY): Payer: Self-pay

## 2017-12-25 ENCOUNTER — Other Ambulatory Visit: Payer: Self-pay

## 2017-12-25 DIAGNOSIS — M170 Bilateral primary osteoarthritis of knee: Secondary | ICD-10-CM | POA: Insufficient documentation

## 2017-12-25 DIAGNOSIS — Z01810 Encounter for preprocedural cardiovascular examination: Secondary | ICD-10-CM | POA: Diagnosis not present

## 2017-12-25 DIAGNOSIS — R9431 Abnormal electrocardiogram [ECG] [EKG]: Secondary | ICD-10-CM | POA: Diagnosis not present

## 2017-12-25 DIAGNOSIS — Z01812 Encounter for preprocedural laboratory examination: Secondary | ICD-10-CM | POA: Insufficient documentation

## 2017-12-25 HISTORY — DX: Headache: R51

## 2017-12-25 HISTORY — DX: Headache, unspecified: R51.9

## 2017-12-25 HISTORY — DX: Unspecified osteoarthritis, unspecified site: M19.90

## 2017-12-25 LAB — BASIC METABOLIC PANEL
Anion gap: 9 (ref 5–15)
BUN: 18 mg/dL (ref 6–20)
CHLORIDE: 104 mmol/L (ref 101–111)
CO2: 27 mmol/L (ref 22–32)
Calcium: 9.5 mg/dL (ref 8.9–10.3)
Creatinine, Ser: 0.65 mg/dL (ref 0.44–1.00)
GFR calc non Af Amer: >60 mL/min (ref >60)
Glucose, Bld: 100 mg/dL — ABNORMAL HIGH (ref 65–99)
POTASSIUM: 4.5 mmol/L (ref 3.5–5.1)
Sodium: 140 mmol/L (ref 135–145)

## 2017-12-25 LAB — CBC
HEMATOCRIT: 40 % (ref 36.0–46.0)
Hemoglobin: 13.5 g/dL (ref 12.0–15.0)
MCH: 31.2 pg (ref 26.0–34.0)
MCHC: 33.8 g/dL (ref 30.0–36.0)
MCV: 92.4 fL (ref 78.0–100.0)
Platelets: 312 10*3/uL (ref 150–400)
RBC: 4.33 MIL/uL (ref 3.87–5.11)
RDW: 12.6 % (ref 11.5–15.5)
WBC: 5 10*3/uL (ref 4.0–10.5)

## 2017-12-25 LAB — ABO/RH: ABO/RH(D): A POS

## 2017-12-25 LAB — SURGICAL PCR SCREEN
MRSA, PCR: POSITIVE — AB
Staphylococcus aureus: POSITIVE — AB

## 2017-12-26 NOTE — Progress Notes (Signed)
CLEARANCE DR. Christella ScheuermannWYMER  08-06-17 CHART

## 2017-12-30 MED ORDER — TRANEXAMIC ACID 1000 MG/10ML IV SOLN
1000.0000 mg | INTRAVENOUS | Status: AC
  Administered 2017-12-31: 1000 mg via INTRAVENOUS
  Filled 2017-12-30: qty 1100

## 2017-12-30 NOTE — Anesthesia Preprocedure Evaluation (Signed)
Anesthesia Evaluation  Patient identified by MRN, date of birth, ID band Patient awake    Reviewed: Allergy & Precautions, NPO status , Patient's Chart, lab work & pertinent test results  Airway Mallampati: II  TM Distance: >3 FB Neck ROM: Full    Dental no notable dental hx.    Pulmonary neg pulmonary ROS,    Pulmonary exam normal breath sounds clear to auscultation       Cardiovascular negative cardio ROS Normal cardiovascular exam Rhythm:Regular Rate:Normal     Neuro/Psych negative neurological ROS  negative psych ROS   GI/Hepatic negative GI ROS, Neg liver ROS,   Endo/Other  negative endocrine ROS  Renal/GU negative Renal ROS  negative genitourinary   Musculoskeletal negative musculoskeletal ROS (+)   Abdominal   Peds negative pediatric ROS (+)  Hematology negative hematology ROS (+)   Anesthesia Other Findings   Reproductive/Obstetrics negative OB ROS                             Anesthesia Physical Anesthesia Plan  ASA: II  Anesthesia Plan: Spinal and Combined Spinal and Epidural   Post-op Pain Management:    Induction:   PONV Risk Score and Plan: 2 and Ondansetron, Dexamethasone and Treatment may vary due to age or medical condition  Airway Management Planned: Nasal Cannula and Natural Airway  Additional Equipment:   Intra-op Plan:   Post-operative Plan:   Informed Consent: I have reviewed the patients History and Physical, chart, labs and discussed the procedure including the risks, benefits and alternatives for the proposed anesthesia with the patient or authorized representative who has indicated his/her understanding and acceptance.     Plan Discussed with: CRNA and Anesthesiologist  Anesthesia Plan Comments: (  )        Anesthesia Quick Evaluation

## 2017-12-31 ENCOUNTER — Encounter (HOSPITAL_COMMUNITY)
Admission: RE | Disposition: A | Discharge status: Home Health | Payer: Self-pay | Source: Ambulatory Visit | Attending: Orthopedic Surgery

## 2017-12-31 ENCOUNTER — Inpatient Hospital Stay (HOSPITAL_COMMUNITY)
Admission: RE | Admit: 2017-12-31 | Discharge: 2018-01-02 | DRG: 462 | Disposition: A | Discharge status: Home Health | Payer: Non-veteran care | Source: Ambulatory Visit | Attending: Orthopedic Surgery | Admitting: Orthopedic Surgery

## 2017-12-31 ENCOUNTER — Inpatient Hospital Stay (HOSPITAL_COMMUNITY): Payer: Non-veteran care | Admitting: Anesthesiology

## 2017-12-31 ENCOUNTER — Encounter (HOSPITAL_COMMUNITY): Payer: Self-pay

## 2017-12-31 ENCOUNTER — Other Ambulatory Visit: Payer: Self-pay

## 2017-12-31 DIAGNOSIS — Z96659 Presence of unspecified artificial knee joint: Secondary | ICD-10-CM

## 2017-12-31 DIAGNOSIS — Z96653 Presence of artificial knee joint, bilateral: Secondary | ICD-10-CM

## 2017-12-31 DIAGNOSIS — M170 Bilateral primary osteoarthritis of knee: Principal | ICD-10-CM | POA: Diagnosis present

## 2017-12-31 DIAGNOSIS — R51 Headache: Secondary | ICD-10-CM | POA: Diagnosis present

## 2017-12-31 DIAGNOSIS — Z7982 Long term (current) use of aspirin: Secondary | ICD-10-CM

## 2017-12-31 DIAGNOSIS — K5900 Constipation, unspecified: Secondary | ICD-10-CM | POA: Diagnosis present

## 2017-12-31 HISTORY — PX: TOTAL KNEE ARTHROPLASTY: SHX125

## 2017-12-31 LAB — TYPE AND SCREEN
ABO/RH(D): A POS
ANTIBODY SCREEN: NEGATIVE

## 2017-12-31 SURGERY — ARTHROPLASTY, KNEE, BILATERAL, TOTAL
Anesthesia: Spinal | Site: Knee | Laterality: Bilateral

## 2017-12-31 MED ORDER — RIVAROXABAN 10 MG PO TABS: 10.0000 mg | ORAL_TABLET | Freq: Every day | ORAL | 0 refills | Status: DC

## 2017-12-31 MED ORDER — DEXAMETHASONE SODIUM PHOSPHATE 10 MG/ML IJ SOLN
INTRAMUSCULAR | Status: AC
  Filled 2017-12-31: qty 1

## 2017-12-31 MED ORDER — SODIUM CHLORIDE 0.9 % IJ SOLN
INTRAMUSCULAR | Status: AC
  Filled 2017-12-31: qty 50

## 2017-12-31 MED ORDER — DOCUSATE SODIUM 100 MG PO CAPS
100.0000 mg | ORAL_CAPSULE | Freq: Two times a day (BID) | ORAL | Status: DC
  Administered 2017-12-31 – 2018-01-02 (×4): 100 mg via ORAL
  Filled 2017-12-31 (×4): qty 1

## 2017-12-31 MED ORDER — MEPERIDINE HCL 50 MG/ML IJ SOLN: 6.2500 mg | INTRAMUSCULAR | Status: DC | PRN

## 2017-12-31 MED ORDER — METHOCARBAMOL 1000 MG/10ML IJ SOLN
500.0000 mg | Freq: Four times a day (QID) | INTRAVENOUS | Status: DC | PRN
  Administered 2017-12-31: 500 mg via INTRAVENOUS
  Filled 2017-12-31: qty 550

## 2017-12-31 MED ORDER — MIDAZOLAM HCL 5 MG/5ML IJ SOLN
INTRAMUSCULAR | Status: DC | PRN
  Administered 2017-12-31: 2 mg via INTRAVENOUS

## 2017-12-31 MED ORDER — LACTATED RINGERS IV SOLN
INTRAVENOUS | Status: DC
  Administered 2017-12-31 (×3): via INTRAVENOUS

## 2017-12-31 MED ORDER — ONDANSETRON HCL 4 MG/2ML IJ SOLN: 4.0000 mg | Freq: Four times a day (QID) | INTRAMUSCULAR | Status: DC | PRN

## 2017-12-31 MED ORDER — ONDANSETRON HCL 4 MG PO TABS: 4.0000 mg | ORAL_TABLET | Freq: Four times a day (QID) | ORAL | Status: DC | PRN

## 2017-12-31 MED ORDER — PROPOFOL 10 MG/ML IV BOLUS
INTRAVENOUS | Status: DC | PRN
  Administered 2017-12-31: 120 mg via INTRAVENOUS

## 2017-12-31 MED ORDER — KETOROLAC TROMETHAMINE 30 MG/ML IJ SOLN
INTRAMUSCULAR | Status: DC | PRN
  Administered 2017-12-31: 60 mg via INTRAMUSCULAR

## 2017-12-31 MED ORDER — POLYETHYLENE GLYCOL 3350 17 G PO PACK
17.0000 g | PACK | Freq: Two times a day (BID) | ORAL | Status: DC
  Administered 2017-12-31 – 2018-01-02 (×3): 17 g via ORAL
  Filled 2017-12-31 (×4): qty 1

## 2017-12-31 MED ORDER — ROPIVACAINE HCL 2 MG/ML IJ SOLN
10.0000 mL/h | INTRAMUSCULAR | Status: DC
  Administered 2017-12-31: 10 mL/h via EPIDURAL
  Filled 2017-12-31 (×3): qty 200

## 2017-12-31 MED ORDER — DIPHENHYDRAMINE HCL 12.5 MG/5ML PO ELIX: 12.5000 mg | ORAL_SOLUTION | ORAL | Status: DC | PRN

## 2017-12-31 MED ORDER — BUPIVACAINE HCL (PF) 0.25 % IJ SOLN
INTRAMUSCULAR | Status: DC | PRN
  Administered 2017-12-31: 60 mL

## 2017-12-31 MED ORDER — KETOROLAC TROMETHAMINE 30 MG/ML IJ SOLN
INTRAMUSCULAR | Status: AC
  Filled 2017-12-31: qty 1

## 2017-12-31 MED ORDER — CELECOXIB 200 MG PO CAPS
200.0000 mg | ORAL_CAPSULE | Freq: Two times a day (BID) | ORAL | Status: DC
  Administered 2017-12-31 – 2018-01-02 (×4): 200 mg via ORAL
  Filled 2017-12-31 (×4): qty 1

## 2017-12-31 MED ORDER — FENTANYL CITRATE (PF) 100 MCG/2ML IJ SOLN
INTRAMUSCULAR | Status: AC
  Filled 2017-12-31: qty 2

## 2017-12-31 MED ORDER — BUPIVACAINE IN DEXTROSE 0.75-8.25 % IT SOLN
INTRATHECAL | Status: DC | PRN
  Administered 2017-12-31: 1.6 mL via INTRATHECAL

## 2017-12-31 MED ORDER — 0.9 % SODIUM CHLORIDE (POUR BTL) OPTIME
TOPICAL | Status: DC | PRN
  Administered 2017-12-31: 1000 mL

## 2017-12-31 MED ORDER — FERROUS SULFATE 325 (65 FE) MG PO TABS
325.0000 mg | ORAL_TABLET | Freq: Three times a day (TID) | ORAL | Status: DC
  Administered 2018-01-01 – 2018-01-02 (×4): 325 mg via ORAL
  Filled 2017-12-31 (×4): qty 1

## 2017-12-31 MED ORDER — CHLORHEXIDINE GLUCONATE 4 % EX LIQD: 60.0000 mL | Freq: Once | CUTANEOUS | Status: DC

## 2017-12-31 MED ORDER — DEXAMETHASONE SODIUM PHOSPHATE 10 MG/ML IJ SOLN
10.0000 mg | Freq: Once | INTRAMUSCULAR | Status: AC
  Administered 2018-01-01: 08:00:00 10 mg via INTRAVENOUS
  Filled 2017-12-31: qty 1

## 2017-12-31 MED ORDER — PROPOFOL 10 MG/ML IV BOLUS
INTRAVENOUS | Status: AC
  Filled 2017-12-31: qty 60

## 2017-12-31 MED ORDER — ASPIRIN 81 MG PO CHEW: 81.0000 mg | CHEWABLE_TABLET | Freq: Two times a day (BID) | ORAL | 0 refills | Status: AC

## 2017-12-31 MED ORDER — OXYCODONE HCL 5 MG PO TABS: 5.0000 mg | ORAL_TABLET | Freq: Once | ORAL | Status: DC | PRN

## 2017-12-31 MED ORDER — BUPIVACAINE HCL (PF) 0.25 % IJ SOLN
INTRAMUSCULAR | Status: AC
  Filled 2017-12-31: qty 30

## 2017-12-31 MED ORDER — FENTANYL CITRATE (PF) 100 MCG/2ML IJ SOLN
INTRAMUSCULAR | Status: AC
Start: 2017-12-31 — End: 2017-12-31
  Filled 2017-12-31: qty 2

## 2017-12-31 MED ORDER — PHENOL 1.4 % MT LIQD
1.0000 | OROMUCOSAL | Status: DC | PRN
  Filled 2017-12-31: qty 177

## 2017-12-31 MED ORDER — DOCUSATE SODIUM 100 MG PO CAPS: 100.0000 mg | ORAL_CAPSULE | Freq: Two times a day (BID) | ORAL | 0 refills | Status: DC

## 2017-12-31 MED ORDER — ONDANSETRON HCL 4 MG/2ML IJ SOLN
INTRAMUSCULAR | Status: DC | PRN
  Administered 2017-12-31: 4 mg via INTRAVENOUS

## 2017-12-31 MED ORDER — LIDOCAINE HCL (PF) 2 % IJ SOLN
INTRAMUSCULAR | Status: DC | PRN
  Administered 2017-12-31 (×2): 5 mL via EPIDURAL

## 2017-12-31 MED ORDER — CEFAZOLIN SODIUM-DEXTROSE 2-4 GM/100ML-% IV SOLN
2.0000 g | INTRAVENOUS | Status: AC
  Administered 2017-12-31: 2 g via INTRAVENOUS
  Filled 2017-12-31: qty 100

## 2017-12-31 MED ORDER — OXYCODONE HCL 5 MG PO TABS
10.0000 mg | ORAL_TABLET | ORAL | Status: DC | PRN
  Administered 2017-12-31 – 2018-01-02 (×10): 10 mg via ORAL
  Filled 2017-12-31 (×10): qty 2

## 2017-12-31 MED ORDER — MAGNESIUM CITRATE PO SOLN: 1.0000 | Freq: Once | ORAL | Status: DC | PRN

## 2017-12-31 MED ORDER — LIDOCAINE 2% (20 MG/ML) 5 ML SYRINGE
INTRAMUSCULAR | Status: AC
  Filled 2017-12-31: qty 5

## 2017-12-31 MED ORDER — VANCOMYCIN HCL IN DEXTROSE 1-5 GM/200ML-% IV SOLN
1000.0000 mg | Freq: Once | INTRAVENOUS | Status: AC
  Administered 2017-12-31: 1000 mg via INTRAVENOUS
  Filled 2017-12-31: qty 200

## 2017-12-31 MED ORDER — ONDANSETRON HCL 4 MG/2ML IJ SOLN
INTRAMUSCULAR | Status: AC
  Filled 2017-12-31: qty 2

## 2017-12-31 MED ORDER — HYDROMORPHONE HCL 1 MG/ML IJ SOLN: 0.5000 mg | INTRAMUSCULAR | Status: DC | PRN

## 2017-12-31 MED ORDER — BISACODYL 10 MG RE SUPP: 10.0000 mg | Freq: Every day | RECTAL | Status: DC | PRN

## 2017-12-31 MED ORDER — ALUM & MAG HYDROXIDE-SIMETH 200-200-20 MG/5ML PO SUSP: 15.0000 mL | ORAL | Status: DC | PRN

## 2017-12-31 MED ORDER — TRANEXAMIC ACID 1000 MG/10ML IV SOLN
1000.0000 mg | Freq: Once | INTRAVENOUS | Status: AC
  Administered 2017-12-31: 1000 mg via INTRAVENOUS
  Filled 2017-12-31: qty 1100

## 2017-12-31 MED ORDER — ACETAMINOPHEN 160 MG/5ML PO SOLN: 325.0000 mg | ORAL | Status: DC | PRN

## 2017-12-31 MED ORDER — RIVAROXABAN 10 MG PO TABS
10.0000 mg | ORAL_TABLET | ORAL | Status: DC
  Administered 2018-01-01: 17:00:00 10 mg via ORAL
  Filled 2017-12-31: qty 1

## 2017-12-31 MED ORDER — ACETAMINOPHEN 500 MG PO TABS: 1000.0000 mg | ORAL_TABLET | Freq: Three times a day (TID) | ORAL | 0 refills | Status: DC

## 2017-12-31 MED ORDER — DEXAMETHASONE SODIUM PHOSPHATE 10 MG/ML IJ SOLN
INTRAMUSCULAR | Status: DC | PRN
  Administered 2017-12-31: 10 mg via INTRAVENOUS

## 2017-12-31 MED ORDER — SODIUM CHLORIDE 0.9 % IJ SOLN
INTRAMUSCULAR | Status: AC
  Filled 2017-12-31: qty 10

## 2017-12-31 MED ORDER — ROPIVACAINE HCL 2 MG/ML IJ SOLN
10.0000 mL/h | INTRAMUSCULAR | Status: DC
  Administered 2018-01-01: 10 mL/h via EPIDURAL
  Filled 2017-12-31 (×3): qty 200

## 2017-12-31 MED ORDER — SODIUM CHLORIDE 0.9 % IR SOLN
Status: DC | PRN
  Administered 2017-12-31: 2000 mL

## 2017-12-31 MED ORDER — ACETAMINOPHEN 500 MG PO TABS
1000.0000 mg | ORAL_TABLET | Freq: Four times a day (QID) | ORAL | Status: DC
  Administered 2017-12-31 – 2018-01-02 (×7): 1000 mg via ORAL
  Filled 2017-12-31 (×7): qty 2

## 2017-12-31 MED ORDER — OXYCODONE HCL 5 MG PO TABS
5.0000 mg | ORAL_TABLET | ORAL | Status: DC | PRN
  Administered 2017-12-31: 5 mg via ORAL
  Filled 2017-12-31: qty 1

## 2017-12-31 MED ORDER — MENTHOL 3 MG MT LOZG: 1.0000 | LOZENGE | OROMUCOSAL | Status: DC | PRN

## 2017-12-31 MED ORDER — ONDANSETRON HCL 4 MG/2ML IJ SOLN: 4.0000 mg | Freq: Once | INTRAMUSCULAR | Status: DC | PRN

## 2017-12-31 MED ORDER — OXYCODONE HCL 5 MG PO TABS: 5.0000 mg | ORAL_TABLET | ORAL | 0 refills | Status: DC | PRN

## 2017-12-31 MED ORDER — ACETAMINOPHEN 325 MG PO TABS: 325.0000 mg | ORAL_TABLET | ORAL | Status: DC | PRN

## 2017-12-31 MED ORDER — METHOCARBAMOL 500 MG PO TABS: 500.0000 mg | ORAL_TABLET | Freq: Four times a day (QID) | ORAL | 0 refills | Status: DC | PRN

## 2017-12-31 MED ORDER — DEXAMETHASONE SODIUM PHOSPHATE 10 MG/ML IJ SOLN: 10.0000 mg | Freq: Once | INTRAMUSCULAR | Status: DC

## 2017-12-31 MED ORDER — FERROUS SULFATE 325 (65 FE) MG PO TABS: 325.0000 mg | ORAL_TABLET | Freq: Three times a day (TID) | ORAL | 3 refills | Status: DC

## 2017-12-31 MED ORDER — METOCLOPRAMIDE HCL 5 MG PO TABS: 5.0000 mg | ORAL_TABLET | Freq: Three times a day (TID) | ORAL | Status: DC | PRN

## 2017-12-31 MED ORDER — METOCLOPRAMIDE HCL 5 MG/ML IJ SOLN: 5.0000 mg | Freq: Three times a day (TID) | INTRAMUSCULAR | Status: DC | PRN

## 2017-12-31 MED ORDER — FENTANYL CITRATE (PF) 100 MCG/2ML IJ SOLN
INTRAMUSCULAR | Status: DC | PRN
  Administered 2017-12-31 (×2): 25 ug via INTRAVENOUS
  Administered 2017-12-31 (×2): 50 ug via INTRAVENOUS
  Administered 2017-12-31 (×2): 25 ug via INTRAVENOUS

## 2017-12-31 MED ORDER — POLYETHYLENE GLYCOL 3350 17 G PO PACK: 17.0000 g | PACK | Freq: Two times a day (BID) | ORAL | 0 refills | Status: DC

## 2017-12-31 MED ORDER — SODIUM CHLORIDE 0.9 % IV SOLN
INTRAVENOUS | Status: DC
  Administered 2017-12-31: 19:00:00 via INTRAVENOUS

## 2017-12-31 MED ORDER — METHOCARBAMOL 500 MG PO TABS
500.0000 mg | ORAL_TABLET | Freq: Four times a day (QID) | ORAL | Status: DC | PRN
  Administered 2017-12-31 – 2018-01-02 (×4): 500 mg via ORAL
  Filled 2017-12-31 (×4): qty 1

## 2017-12-31 MED ORDER — SODIUM CHLORIDE 0.9 % IJ SOLN
INTRAMUSCULAR | Status: DC | PRN
  Administered 2017-12-31: 60 mL

## 2017-12-31 MED ORDER — OXYCODONE HCL 5 MG/5ML PO SOLN
5.0000 mg | Freq: Once | ORAL | Status: DC | PRN
  Filled 2017-12-31: qty 5

## 2017-12-31 MED ORDER — KETOROLAC TROMETHAMINE 30 MG/ML IJ SOLN: 30.0000 mg | Freq: Once | INTRAMUSCULAR | Status: DC | PRN

## 2017-12-31 MED ORDER — FENTANYL CITRATE (PF) 100 MCG/2ML IJ SOLN: 25.0000 ug | INTRAMUSCULAR | Status: DC | PRN

## 2017-12-31 MED ORDER — MIDAZOLAM HCL 2 MG/2ML IJ SOLN
INTRAMUSCULAR | Status: AC
  Filled 2017-12-31: qty 2

## 2017-12-31 MED ORDER — CEFAZOLIN SODIUM-DEXTROSE 2-4 GM/100ML-% IV SOLN
2.0000 g | Freq: Four times a day (QID) | INTRAVENOUS | Status: AC
  Administered 2017-12-31 – 2018-01-01 (×2): 2 g via INTRAVENOUS
  Filled 2017-12-31 (×2): qty 100

## 2017-12-31 SURGICAL SUPPLY — 62 items
ADH SKN CLS APL DERMABOND .7 (GAUZE/BANDAGES/DRESSINGS) ×2
BAG SPEC THK2 15X12 ZIP CLS (MISCELLANEOUS) ×1
BAG ZIPLOCK 12X15 (MISCELLANEOUS) ×2 IMPLANT
BANDAGE ACE 6X5 VEL STRL LF (GAUZE/BANDAGES/DRESSINGS) ×6 IMPLANT
BANDAGE ESMARK 6X9 LF (GAUZE/BANDAGES/DRESSINGS) ×1 IMPLANT
BLADE SAW SGTL 13.0X1.19X90.0M (BLADE) ×6 IMPLANT
BLADE SURG SZ10 CARB STEEL (BLADE) ×4 IMPLANT
BNDG CMPR 9X6 STRL LF SNTH (GAUZE/BANDAGES/DRESSINGS) ×1
BNDG COHESIVE 4X5 TAN STRL (GAUZE/BANDAGES/DRESSINGS) ×3 IMPLANT
BNDG ESMARK 6X9 LF (GAUZE/BANDAGES/DRESSINGS) ×3
BOWL SMART MIX CTS (DISPOSABLE) ×6 IMPLANT
CAPT KNEE TOTAL 3 ATTUNE ×4 IMPLANT
CEMENT HV SMART SET (Cement) ×8 IMPLANT
COVER SURGICAL LIGHT HANDLE (MISCELLANEOUS) ×3 IMPLANT
CUFF TOURN SGL QUICK 34 (TOURNIQUET CUFF) ×6
CUFF TRNQT CYL 34X4X40X1 (TOURNIQUET CUFF) ×2 IMPLANT
DECANTER SPIKE VIAL GLASS SM (MISCELLANEOUS) ×3 IMPLANT
DERMABOND ADVANCED (GAUZE/BANDAGES/DRESSINGS) ×4
DERMABOND ADVANCED .7 DNX12 (GAUZE/BANDAGES/DRESSINGS) ×2 IMPLANT
DRAPE EXTREMITY BILATERAL (DRAPES) ×3 IMPLANT
DRAPE INCISE IOBAN 66X45 STRL (DRAPES) ×3 IMPLANT
DRAPE SHEET LG 3/4 BI-LAMINATE (DRAPES) ×2 IMPLANT
DRAPE U-SHAPE 47X51 STRL (DRAPES) ×9 IMPLANT
DRESSING AQUACEL AG SP 3.5X10 (GAUZE/BANDAGES/DRESSINGS) ×2 IMPLANT
DRSG AQUACEL AG SP 3.5X10 (GAUZE/BANDAGES/DRESSINGS) ×6
DURAPREP 26ML APPLICATOR (WOUND CARE) ×6 IMPLANT
ELECT REM PT RETURN 15FT ADLT (MISCELLANEOUS) ×3 IMPLANT
FACESHIELD WRAPAROUND (MASK) ×9 IMPLANT
FACESHIELD WRAPAROUND OR TEAM (MASK) ×3 IMPLANT
GLOVE BIOGEL M 7.0 STRL (GLOVE) IMPLANT
GLOVE BIOGEL PI IND STRL 7.5 (GLOVE) ×1 IMPLANT
GLOVE BIOGEL PI IND STRL 8.5 (GLOVE) ×1 IMPLANT
GLOVE BIOGEL PI INDICATOR 7.5 (GLOVE) ×2
GLOVE BIOGEL PI INDICATOR 8.5 (GLOVE) ×2
GLOVE ECLIPSE 8.0 STRL XLNG CF (GLOVE) ×6 IMPLANT
GLOVE ORTHO TXT STRL SZ7.5 (GLOVE) ×6 IMPLANT
GOWN STRL REUS W/TWL LRG LVL3 (GOWN DISPOSABLE) ×3 IMPLANT
GOWN STRL REUS W/TWL XL LVL3 (GOWN DISPOSABLE) ×3 IMPLANT
HANDPIECE INTERPULSE COAX TIP (DISPOSABLE) ×3
HOLDER FOLEY CATH W/STRAP (MISCELLANEOUS) ×2 IMPLANT
MANIFOLD NEPTUNE II (INSTRUMENTS) ×3 IMPLANT
NDL SAFETY ECLIPSE 18X1.5 (NEEDLE) IMPLANT
NEEDLE HYPO 18GX1.5 SHARP (NEEDLE) ×3
NS IRRIG 1000ML POUR BTL (IV SOLUTION) ×3 IMPLANT
PACK TOTAL KNEE CUSTOM (KITS) ×3 IMPLANT
SET HNDPC FAN SPRY TIP SCT (DISPOSABLE) ×1 IMPLANT
SET PAD KNEE POSITIONER (MISCELLANEOUS) ×6 IMPLANT
SPONGE LAP 18X18 X RAY DECT (DISPOSABLE) ×4 IMPLANT
STOCKINETTE 8 INCH (MISCELLANEOUS) ×3 IMPLANT
SUT MNCRL AB 4-0 PS2 18 (SUTURE) ×6 IMPLANT
SUT STRATAFIX 1PDS 45CM VIOLET (SUTURE) ×4 IMPLANT
SUT VIC AB 1 CT1 36 (SUTURE) ×6 IMPLANT
SUT VIC AB 2-0 CT1 27 (SUTURE) ×12
SUT VIC AB 2-0 CT1 TAPERPNT 27 (SUTURE) ×4 IMPLANT
SUT VLOC 180 0 24IN GS25 (SUTURE) ×2 IMPLANT
SYR 3ML LL SCALE MARK (SYRINGE) ×2 IMPLANT
SYRINGE 60CC LL (MISCELLANEOUS) ×3 IMPLANT
TRAY FOLEY CATH SILVER 14FR (SET/KITS/TRAYS/PACK) ×2 IMPLANT
TRAY FOLEY W/METER SILVER 16FR (SET/KITS/TRAYS/PACK) ×1 IMPLANT
WATER STERILE IRR 1000ML POUR (IV SOLUTION) ×5 IMPLANT
WRAP KNEE MAXI GEL POST OP (GAUZE/BANDAGES/DRESSINGS) ×6 IMPLANT
YANKAUER SUCT BULB TIP 10FT TU (MISCELLANEOUS) IMPLANT

## 2017-12-31 NOTE — Anesthesia Procedure Notes (Deleted)
Epidural

## 2017-12-31 NOTE — Interval H&P Note (Signed)
History and Physical Interval Note:  12/31/2017 12:11 PM  [name]  has presented today for surgery, with the diagnosis of bilateral knee osteoarthritis  The various methods of treatment have been discussed with the patient and family. After consideration of risks, benefits and other options for treatment, the patient has consented to  Procedure(s): TOTAL KNEE BILATERAL (Bilateral) as a surgical intervention .  The patient's history has been reviewed, patient examined, no change in status, stable for surgery.  I have reviewed the patient's chart and labs.  Questions were answered to the patient's satisfaction.     Laura PalMatthew D Jayani Rozman

## 2017-12-31 NOTE — Op Note (Signed)
NAME:  [name]                      MEDICAL RECORD NO.:  [id number]                             FACILITY:  [facility]      PHYSICIAN:  Madlyn Frankel. Charlann Boxer, M.D.  DATE OF BIRTH:  Jun 13, 1951      DATE OF PROCEDURE:  12/31/2017     This was a scheduled simultaneously performed bilateral total knee arthroplasties.  Below are templated operative notes to indicate specifics of each knee including component size.  I began the procedure with her left knee and once closure of the extensor mechanism began when he worked on her right knee.  There were no complications with either procedure.                               OPERATIVE REPORT         PREOPERATIVE DIAGNOSIS:  Left knee osteoarthritis.      POSTOPERATIVE DIAGNOSIS:  Left knee osteoarthritis.      FINDINGS:  The patient was noted to have complete loss of cartilage and   bone-on-bone arthritis with associated osteophytes in the medial and patellofemoral compartments of   the knee with a significant synovitis and associated effusion.      PROCEDURE:  Left total knee replacement.      COMPONENTS USED:  DePuy Attune rotating platform posterior stabilized knee   system, a size 6N femur, 5 tibia, size 7 mm PS AOX insert, and 35 anatomic patellar   button.      SURGEON:  Madlyn Frankel. Charlann Boxer, M.D.      ASSISTANT:  Lanney Gins, PA-C.      ANESTHESIA:  General, Spinal and Epidural.      SPECIMENS:  None.      COMPLICATION:  None.      DRAINS:  None.  EBL: <100cc      TOURNIQUET TIME:  27 min at     The patient was stable to the recovery room.      INDICATION FOR PROCEDURE:  [name] is a 67 y.o. female patient of   mine.  The patient had been seen, evaluated, and treated conservatively in the   office with medication, activity modification, and injections.  The patient had   radiographic changes of bone-on-bone arthritis with endplate sclerosis and osteophytes noted.      The patient failed conservative measures  including medication, injections, and activity modification, and at this point was ready for more definitive measures.   Based on the radiographic changes and failed conservative measures, the patient   decided to proceed with total knee replacement.  Risks of infection,   DVT, component failure, need for revision surgery, postop course, and   expectations were all   discussed and reviewed.  Consent was obtained for benefit of pain   relief.      PROCEDURE IN DETAIL:  The patient was brought to the operative theater.   Once adequate anesthesia, preoperative antibiotics, 2 gm of Ancef, 1 gm of Vancomycin (positive MRSA screening) administered, the patient was positioned supine with bilateral thigh tourniquets placed.  Both lower extremities were prepped and draped in sterile fashion.  A time-   out was performed identifying the patient, planned procedure, and  extremity.      Both lower extremities were placed in the [facility] leg holder.  The left leg was   exsanguinated, tourniquet elevated to 250 mmHg.  A midline incision was   made followed by median parapatellar arthrotomy.  Following initial   exposure, attention was first directed to the patella.  Precut   measurement was noted to be 25 mm.  I resected down to 14-15 mm and used a   35 anatomic patellar button to restore patellar height as well as cover the cut   surface.      The lug holes were drilled and a metal shim was placed to protect the   patella from retractors and saw blades.      At this point, attention was now directed to the femur.  The femoral   canal was opened with a drill, irrigated to try to prevent fat emboli.  An   intramedullary rod was passed at 3 degrees valgus, 9 mm of bone was   resected off the distal femur.  Following this resection, the tibia was   subluxated anteriorly.  Using the extramedullary guide, 2 mm of bone was resected off   the proximal medial tibia.  We confirmed the gap would be   stable  medially and laterally with size 5 spacer block as well as confirmed   the cut was perpendicular in the coronal plane, checking with an alignment rod.      Once this was done, I sized the femur to be a size 6 in the anterior-   posterior dimension, chose a narrow component based on medial and   lateral dimension.  The size 6 rotation block was then pinned in   position anterior referenced using the C-clamp to set rotation.  The   anterior, posterior, and  chamfer cuts were made without difficulty nor   notching making certain that I was along the anterior cortex to help   with flexion gap stability.      The final box cut was made off the lateral aspect of distal femur.      At this point, the tibia was sized to be a size 5, the size 5 tray was   then pinned in position through the medial third of the tubercle,   drilled, and keel punched.  Trial reduction was now carried with a 6 femur,  5 tibia, a size 6 then 7 mm PS insert, and the 35anatomic patella botton.  The knee was brought to   extension, full extension with good flexion stability with the patella   tracking through the trochlea without application of pressure.  Given   all these findings the femoral lug holes were drilled and then the trial components removed.  Final components were   opened and cement was mixed.  The knee was irrigated with normal saline   solution and pulse lavage.  The synovial lining was   then injected with 30 cc of 0.25% Marcaine with epinephrine and 1 cc of Toradol plus 30 cc of NS for a total of 61 cc.      The knee was irrigated.  Final implants were then cemented onto clean and   dried cut surfaces of bone with the knee brought to extension with a size 7 mm PS trial insert.      Once the cement had fully cured, the excess cement was removed   throughout the knee.  I confirmed I was satisfied with the range of  motion and stability, and the final size 7 mm PS AOX insert was chosen.  It was   placed  into the knee.      The tourniquet had been let down at 27 minutes.  No significant   hemostasis required.  The extensor mechanism was then reapproximated using #1 Vicryl and #1 Stratafix sutures with the knee in flexion.  The remaining wound was closed with 2-0 Vicryl and running 4-0 Monocryl.   The knee was cleaned, dried, dressed sterilely using Dermabond and   Aquacel dressing.  The patient was then   brought to recovery room in stable condition, tolerating the procedure   well.   Please note that Physician Assistant, Lanney Gins, PA-C, was present for the entirety of the case, and was utilized for pre-operative positioning, peri-operative retractor management, general facilitation of the procedure.  He was also utilized for primary wound closure at the end of the case.              Madlyn Frankel Charlann Boxer, M.D.    12/31/2017 12:36 PM  NAME:  [name] [facility]                      MEDICAL RECORD NO.:  [id number]                             FACILITY:  [facility]      PHYSICIAN:  Madlyn Frankel. Charlann Boxer, M.D.  DATE OF BIRTH:  09-24-1951      DATE OF PROCEDURE:  12/31/2017                                     OPERATIVE REPORT         PREOPERATIVE DIAGNOSIS:  Right knee osteoarthritis.      POSTOPERATIVE DIAGNOSIS:  Right knee osteoarthritis.      FINDINGS:  The patient was noted to have complete loss of cartilage and   bone-on-bone arthritis with associated osteophytes in the medial and patellofemoral compartments of the knee with a significant synovitis and associated effusion.      PROCEDURE:  Right total knee replacement.      COMPONENTS USED:  DePuy Attune rotating platform posterior stabilized knee   system, a size 6N femur, 5 tibia, size 8 mm PS AOX insert, and 35 anatomic patellar   button.      SURGEON:  Madlyn Frankel. Charlann Boxer, M.D.      ASSISTANT:  Lanney Gins, PA-C.      ANESTHESIA:  General, Spinal and Epidural.      SPECIMENS:  None.      COMPLICATION:  None.      DRAINS:   None.  EBL: <150cc      TOURNIQUET TIME:  37 min at 250 mmHg      The patient was stable to the recovery room.      INDICATION FOR PROCEDURE:  [name] is a 67 y.o. female patient of   mine.  The patient had been seen, evaluated, and treated conservatively in the   office with medication, activity modification, and injections.  The patient had   radiographic changes of bone-on-bone arthritis with endplate sclerosis and osteophytes noted.      The patient failed conservative measures including medication, injections, and activity modification, and at this point was ready for more definitive measures.   Based  on the radiographic changes and failed conservative measures, the patient   decided to proceed with total knee replacement.  Risks of infection,   DVT, component failure, need for revision surgery, postop course, and   expectations were all   discussed and reviewed.  Consent was obtained for benefit of pain   relief.      PROCEDURE IN DETAIL:  The patient was brought to the operative theater.   Once adequate anesthesia, preoperative antibiotics, 2 gm of Ancef, 1 gm of Vancomycin, 1 gm of Tranexamic Acid, and 10 mg of Decadron administered, the patient was positioned supine with bilateral thigh tourniquets placed.  Both lower extremities had been prepped and draped in sterile fashion.  A time-   out was performed identifying the patient, planned procedure, and   extremity.      Both [facility]  The leg was   exsanguinated, tourniquet elevated to 250 mmHg.  A midline incision was   made followed by median parapatellar arthrotomy.  Following initial   exposure, attention was first directed to the patella.  Precut   measurement was noted to be 25 mm.  I resected down to 14 mm and used a   35 anatomic patellar button to restore patellar height as well as cover the cut   surface.      The lug holes were drilled and a metal shim was  placed to protect the   patella from retractors and saw blades.      At this point, attention was now directed to the femur.  The femoral   canal was opened with a drill, irrigated to try to prevent fat emboli.  An   intramedullary rod was passed at 3 degrees valgus, 9 mm of bone was   resected off the distal femur.  Following this resection, the tibia was   subluxated anteriorly.  Using the extramedullary guide, 2 mm of bone was resected off   the proximal medial tibia.  We confirmed the gap would be   stable medially and laterally with a size 5 spacer block as well as confirmed   the cut was perpendicular in the coronal plane, checking with an alignment rod.      Once this was done, I sized the femur to be a size 6 in the anterior-   posterior dimension, chose a narrow component based on medial and   lateral dimension.  The size 6 rotation block was then pinned in   position anterior referenced using the C-clamp to set rotation.  The   anterior, posterior, and  chamfer cuts were made without difficulty nor   notching making certain that I was along the anterior cortex to help   with flexion gap stability.      The final box cut was made off the lateral aspect of distal femur.      At this point, the tibia was sized to be a size 5, the size 5 tray was   then pinned in position through the medial third of the tubercle,   drilled, and keel punched.  Trial reduction was now carried with a 6 femur,  5 tibia, a size 6 then 8 mm PS insert, and the 35 anatomic patella botton.  The knee was brought to   extension, full extension with good flexion stability with the patella   tracking through the trochlea without application of pressure.  Given   all these findings the femoral lug  holes were drilled and then the trial components removed.  Final components were   opened and cement was mixed.  The knee was irrigated with normal saline   solution and pulse lavage.  The synovial lining was   then  injected with 30 cc 0.25% Marcaine with epinephrine and 1 cc of Toradol plus 30 cc pf NS for a total of 61 cc.      The knee was irrigated.  Final implants were then cemented onto clean and   dried cut surfaces of bone with the knee brought to extension with a 8 mm PS trial insert.      Once the cement had fully cured, the excess cement was removed   throughout the knee.  I confirmed I was satisfied with the range of   motion and stability, and the final 8 mm PS AOX insert was chosen.  It was   placed into the knee.      The tourniquet had been let down at 37 minutes.  No significant   hemostasis required.  The   extensor mechanism was then reapproximated using #1 Vicryl and #1 Stratafix sutures with the knee   in flexion.  The   remaining wound was closed with 2-0 Vicryl and running 4-0 Monocryl.   The knee was cleaned, dried, dressed sterilely using Dermabond and   Aquacel dressing.  The patient was then   brought to recovery room in stable condition, tolerating the procedure   well.   Please note that Physician Assistant, Lanney Gins, PA-C, was present for the entirety of the case, and was utilized for pre-operative positioning, peri-operative retractor management, general facilitation of the procedure.  He was also utilized for primary wound closure at the end of the case.              Madlyn Frankel Charlann Boxer, M.D.    12/31/2017 12:36 PM

## 2017-12-31 NOTE — Discharge Instructions (Signed)

## 2017-12-31 NOTE — Anesthesia Procedure Notes (Signed)
Procedure Name: LMA Insertion Date/Time: 12/31/2017 1:25 PM Performed by: Jhonnie GarnerMarshall, Russie Gulledge M, CRNA Pre-anesthesia Checklist: Patient identified, Emergency Drugs available, Suction available and Patient being monitored Patient Re-evaluated:Patient Re-evaluated prior to induction Oxygen Delivery Method: Circle system utilized Preoxygenation: Pre-oxygenation with 100% oxygen Induction Type: IV induction Ventilation: Mask ventilation without difficulty LMA: LMA inserted LMA Size: 4.0 Number of attempts: 1 Placement Confirmation: positive ETCO2 and breath sounds checked- equal and bilateral Tube secured with: Tape Dental Injury: Teeth and Oropharynx as per pre-operative assessment

## 2017-12-31 NOTE — Anesthesia Procedure Notes (Addendum)
Spinal  Patient location during procedure: OR Start time: 12/31/2017 1:20 PM End time: 12/31/2017 1:28 PM Staffing Anesthesiologist: Bethena Midgetddono, Esparanza Krider, MD Performed: anesthesiologist  Preanesthetic Checklist Completed: patient identified, site marked, surgical consent, pre-op evaluation, timeout performed, IV checked, risks and benefits discussed and monitors and equipment checked Spinal Block Patient position: sitting Prep: site prepped and draped and DuraPrep Patient monitoring: heart rate, cardiac monitor, continuous pulse ox and blood pressure Approach: midline Location: L3-4 Injection technique: single-shot Needle Needle type: Pencan  Needle gauge: 24 G Needle length: 10 cm Needle insertion depth: 6 cm Catheter type: closed end flexible Catheter at skin depth: 11 cm Assessment Sensory level: T10 Events: paresthesia Additional Notes Paresthesia midline transient   +CSF / DOSE 11 mg /  +CSF

## 2017-12-31 NOTE — Interval H&P Note (Signed)
History and Physical Interval Note:  12/31/2017 12:25 PM  [name]  has presented today for surgery, with the diagnosis of bilateral knee osteoarthritis  The various methods of treatment have been discussed with the patient and family. After consideration of risks, benefits and other options for treatment, the patient has consented to  Procedure(s): TOTAL KNEE BILATERAL (Bilateral) as a surgical intervention .  The patient's history has been reviewed, patient examined, no change in status, stable for surgery.  I have reviewed the patient's chart and labs.  Questions were answered to the patient's satisfaction.     Shelda PalMatthew D Keiona Jenison

## 2017-12-31 NOTE — Transfer of Care (Signed)
Immediate Anesthesia Transfer of Care Note  Patient: [name]  Procedure(s) Performed: TOTAL KNEE BILATERAL (Bilateral Knee)  Patient Location: PACU  Anesthesia Type:General  Level of Consciousness: awake, alert  and oriented  Airway & Oxygen Therapy: Patient Spontanous Breathing and Patient connected to face mask oxygen  Post-op Assessment: Report given to RN and Post -op Vital signs reviewed and stable  Post vital signs: Reviewed and stable  Last Vitals:  Vitals:   12/31/17 0923  BP: 140/77  Pulse: 69  Resp: 16  Temp: 37.1 C  SpO2: 100%    Last Pain:  Vitals:   12/31/17 0923  TempSrc: Oral         Complications: No apparent anesthesia complications

## 2017-12-31 NOTE — Anesthesia Postprocedure Evaluation (Signed)
Anesthesia Post Note  Patient: [name]  Procedure(s) Performed: TOTAL KNEE BILATERAL (Bilateral Knee)     Patient location during evaluation: PACU Anesthesia Type: Spinal Level of consciousness: sedated Pain management: pain level controlled Vital Signs Assessment: post-procedure vital signs reviewed and stable Respiratory status: patient remains intubated per anesthesia plan Cardiovascular status: stable Postop Assessment: no apparent nausea or vomiting Anesthetic complications: no    Last Vitals:  Vitals:   12/31/17 0923 12/31/17 1536  BP: 140/77 137/79  Pulse: 69 74  Resp: 16 15  Temp: 37.1 C 36.5 C  SpO2: 100% 100%    Last Pain:  Vitals:   12/31/17 1536  TempSrc:   PainSc: 0-No pain                 Charelle Petrakis

## 2018-01-01 DIAGNOSIS — M170 Bilateral primary osteoarthritis of knee: Secondary | ICD-10-CM | POA: Diagnosis present

## 2018-01-01 DIAGNOSIS — K5900 Constipation, unspecified: Secondary | ICD-10-CM | POA: Diagnosis present

## 2018-01-01 DIAGNOSIS — Z7982 Long term (current) use of aspirin: Secondary | ICD-10-CM | POA: Diagnosis not present

## 2018-01-01 DIAGNOSIS — R51 Headache: Secondary | ICD-10-CM | POA: Diagnosis present

## 2018-01-01 DIAGNOSIS — Z96653 Presence of artificial knee joint, bilateral: Secondary | ICD-10-CM

## 2018-01-01 LAB — BASIC METABOLIC PANEL
ANION GAP: 5 (ref 5–15)
BUN: 14 mg/dL (ref 6–20)
CHLORIDE: 105 mmol/L (ref 101–111)
CO2: 26 mmol/L (ref 22–32)
Calcium: 8.5 mg/dL — ABNORMAL LOW (ref 8.9–10.3)
Creatinine, Ser: 0.58 mg/dL (ref 0.44–1.00)
GFR calc non Af Amer: >60 mL/min (ref >60)
Glucose, Bld: 125 mg/dL — ABNORMAL HIGH (ref 65–99)
Potassium: 4.9 mmol/L (ref 3.5–5.1)
SODIUM: 136 mmol/L (ref 135–145)

## 2018-01-01 LAB — CBC
HCT: 30.1 % — ABNORMAL LOW (ref 36.0–46.0)
HEMOGLOBIN: 10.2 g/dL — AB (ref 12.0–15.0)
MCH: 31.2 pg (ref 26.0–34.0)
MCHC: 33.9 g/dL (ref 30.0–36.0)
MCV: 92 fL (ref 78.0–100.0)
Platelets: 281 10*3/uL (ref 150–400)
RBC: 3.27 MIL/uL — AB (ref 3.87–5.11)
RDW: 12.6 % (ref 11.5–15.5)
WBC: 13.4 10*3/uL — ABNORMAL HIGH (ref 4.0–10.5)

## 2018-01-01 NOTE — Progress Notes (Signed)
Patient ID: [name], female   DOB: 1951/10/27, 67 y.o.   MRN: [id number] Subjective: 1 Day Post-Op Procedure(s) (LRB): TOTAL KNEE BILATERAL (Bilateral)    Patient reports pain as mild as she has epidural catheter in place.  No events but not much sleep  Objective:   VITALS:   Vitals:   01/01/18 0618 01/01/18 0912  BP: (!) 97/52 (!) 102/55  Pulse: 67 67  Resp: 16 16  Temp: 98.1 F (36.7 C) (!) 97.5 F (36.4 C)  SpO2: 100% 100%    Neurovascular intact - intact sensibility in lower legs, active motor function Incision: dressing C/D/I - bilateral  LABS Recent Labs    01/01/18 0552  HGB 10.2*  HCT 30.1*  WBC 13.4*  PLT 281    Recent Labs    01/01/18 0552  NA 136  K 4.9  BUN 14  CREATININE 0.58  GLUCOSE 125*    No results for input(s): LABPT, INR in the last 72 hours.   Assessment/Plan: 1 Day Post-Op Procedure(s) (LRB): TOTAL KNEE BILATERAL (Bilateral)   Advance diet Up with therapy   Plan to remove epidural this am Discharge pending pain control and activity, tomorrow or Thrusday

## 2018-01-01 NOTE — Evaluation (Signed)
Physical Therapy Evaluation Patient Details Name: [name] MRN: [id number] DOB: [birth year] Today's Date: 01/01/2018   History of Present Illness  Pt is a 67 year old female s/p bilateral TKAs  Clinical Impression  Pt is s/p bilateral TKAs resulting in the deficits listed below (see PT Problem List).  Pt will benefit from skilled PT to increase their independence and safety with mobility to allow discharge to the venue listed below.  Pt very eager to begin therapy and reports performing LE exercises in preparation prior to admission.  Pt assisted with ambulating and then performed LE exercises in recliner.  Pt plans to d/c home with assist from spouse and her best friend.     Follow Up Recommendations Home health PT    Equipment Recommendations  None recommended by PT    Recommendations for Other Services       Precautions / Restrictions Precautions Precautions: Fall;Knee Restrictions Other Position/Activity Restrictions: WBAT bilaterally      Mobility  Bed Mobility Overal bed mobility: Needs Assistance Bed Mobility: Supine to Sit     Supine to sit: Min guard;HOB elevated     General bed mobility comments: verbal cues for technique  Transfers Overall transfer level: Needs assistance Equipment used: Rolling walker (2 wheeled) Transfers: Sit to/from Stand Sit to Stand: Min assist;From elevated surface;+2 safety/equipment         General transfer comment: assist to rise and steady, verbal cues for UE and LE positioning and weight shifting  Ambulation/Gait Ambulation/Gait assistance: Min guard Ambulation Distance (Feet): 60 Feet Assistive device: Rolling walker (2 wheeled) Gait Pattern/deviations: Step-through pattern;Decreased stride length;Trunk flexed     General Gait Details: verbal cues for technique, RW positioning, posture, no symptoms with ambulating  Stairs            Wheelchair Mobility    Modified Rankin (Stroke Patients Only)        Balance                                             Pertinent Vitals/Pain Pain Assessment: 0-10 Pain Score: 3  Pain Location: bil knees Pain Descriptors / Indicators: Sore Pain Intervention(s): Limited activity within patient's tolerance;Repositioned;Monitored during session;Premedicated before session    Home Living Family/patient expects to be discharged to:: Private residence Living Arrangements: Spouse/significant other Available Help at Discharge: Family;Friend(s);Available 24 hours/day Type of Home: House Home Access: (small threshold)     Home Layout: Able to live on main level with bedroom/bathroom Home Equipment: Walker - 2 wheels;Bedside commode      Prior Function Level of Independence: Independent               Hand Dominance        Extremity/Trunk Assessment        Lower Extremity Assessment Lower Extremity Assessment: RLE deficits/detail;LLE deficits/detail RLE Deficits / Details: able to perform SLR, AAROM knee flexion approx 85* LLE Deficits / Details: able to perform SLR, AAROM knee flexion approx 85*       Communication   Communication: No difficulties  Cognition                                              General Comments      Exercises Total  Joint Exercises Ankle Circles/Pumps: AROM;Both;10 reps Quad Sets: AROM;Both;10 reps Short Arc Quad: AROM;Both;10 reps Heel Slides: AAROM;10 reps;Both Hip ABduction/ADduction: AROM;Both;10 reps Straight Leg Raises: AROM;Both;10 reps   Assessment/Plan    PT Assessment Patient needs continued PT services  PT Problem List Decreased mobility;Decreased strength;Decreased range of motion;Decreased knowledge of use of DME;Pain;Decreased knowledge of precautions       PT Treatment Interventions Stair training;Gait training;DME instruction;Therapeutic activities;Therapeutic exercise;Patient/family education;Functional mobility training    PT Goals  (Current goals can be found in the Care Plan section)  Acute Rehab PT Goals PT Goal Formulation: With patient Time For Goal Achievement: 01/05/18 Potential to Achieve Goals: Good    Frequency 7X/week   Barriers to discharge        Co-evaluation               AM-PAC PT "6 Clicks" Daily Activity  Outcome Measure Difficulty turning over in bed (including adjusting bedclothes, sheets and blankets)?: None Difficulty moving from lying on back to sitting on the side of the bed? : A Lot Difficulty sitting down on and standing up from a chair with arms (e.g., wheelchair, bedside commode, etc,.)?: Unable Help needed moving to and from a bed to chair (including a wheelchair)?: A Little Help needed walking in hospital room?: A Little Help needed climbing 3-5 steps with a railing? : A Lot 6 Click Score: 15    End of Session Equipment Utilized During Treatment: Gait belt Activity Tolerance: Patient tolerated treatment well Patient left: in chair;with call bell/phone within reach;with family/visitor present Nurse Communication: Mobility status PT Visit Diagnosis: Other abnormalities of gait and mobility (R26.89)    Time: 1025-1056 PT Time Calculation (min) (ACUTE ONLY): 31 min   Charges:   PT Evaluation $PT Eval Moderate Complexity: 1 Mod PT Treatments $Therapeutic Exercise: 8-22 mins   PT G Codes:       Zenovia Jarred, PT, DPT 01/01/2018 Pager: [phone]   Maida Sale E 01/01/2018, 11:52 AM

## 2018-01-01 NOTE — Progress Notes (Signed)
Discharge planning, spoke with patient and friend at beside. [facility] to [facility] and treat. Contacted [facility] for referral. Has RW and 3-n-1. [phone]

## 2018-01-01 NOTE — Progress Notes (Signed)
Physical Therapy Treatment Patient Details Name: Jules Schickaula K Smay MRN: [id number] DOB: 07-Dec-1951 Today's Date: 01/01/2018    History of Present Illness Pt is a 67 year old female s/p bilateral TKAs    PT Comments    Pt ambulated again in hallway and assisted back to bed.  Pt making very good progress and anticipates d/c home tomorrow.  Follow Up Recommendations  Home health PT     Equipment Recommendations  None recommended by PT    Recommendations for Other Services       Precautions / Restrictions Precautions Precautions: Fall;Knee Restrictions Other Position/Activity Restrictions: WBAT bilaterally    Mobility  Bed Mobility Overal bed mobility: Needs Assistance Bed Mobility: Sit to Supine       Sit to supine: Min guard   General bed mobility comments: pt self assisted LEs onto bed  Transfers Overall transfer level: Needs assistance Equipment used: Rolling walker (2 wheeled) Transfers: Sit to/from Stand Sit to Stand: Min assist         General transfer comment: slight assist to steady with rise, verbal cues for UE and LE positioning  Ambulation/Gait Ambulation/Gait assistance: Min guard Ambulation Distance (Feet): 160 Feet Assistive device: Rolling walker (2 wheeled) Gait Pattern/deviations: Step-through pattern;Decreased stride length;Trunk flexed     General Gait Details: verbal cues for technique, RW positioning, posture, able to progress distance   Information systems managertairs            Wheelchair Mobility    Modified Rankin (Stroke Patients Only)       Balance                                            Cognition Arousal/Alertness: Awake/alert Behavior During Therapy: WFL for tasks assessed/performed Overall Cognitive Status: Within Functional Limits for tasks assessed                                        Exercises      General Comments        Pertinent Vitals/Pain Pain Assessment: 0-10 Pain Score: 2   Pain Location: bil knees Pain Descriptors / Indicators: Sore Pain Intervention(s): Limited activity within patient's tolerance;Repositioned;Monitored during session    Home Living                      Prior Function            PT Goals (current goals can now be found in the care plan section) Progress towards PT goals: Progressing toward goals    Frequency    7X/week      PT Plan Current plan remains appropriate    Co-evaluation              AM-PAC PT "6 Clicks" Daily Activity  Outcome Measure  Difficulty turning over in bed (including adjusting bedclothes, sheets and blankets)?: None Difficulty moving from lying on back to sitting on the side of the bed? : A Lot Difficulty sitting down on and standing up from a chair with arms (e.g., wheelchair, bedside commode, etc,.)?: Unable Help needed moving to and from a bed to chair (including a wheelchair)?: A Little Help needed walking in hospital room?: A Little Help needed climbing 3-5 steps with a railing? : A Little 6 Click Score: 16  End of Session   Activity Tolerance: Patient tolerated treatment well Patient left: with call bell/phone within reach;with family/visitor present;in bed Nurse Communication: Mobility status PT Visit Diagnosis: Other abnormalities of gait and mobility (R26.89)     Time: 1610-9604 PT Time Calculation (min) (ACUTE ONLY): 14 min  Charges:  $Gait Training: 8-22 mins                    G Codes:       Zenovia Jarred, PT, DPT 01/01/2018 Pager: [phone]  Maida Sale E 01/01/2018, 4:16 PM

## 2018-01-01 NOTE — Plan of Care (Signed)
Plan of care reviewed and discussed with patient and family. Verbalizes u/o same. Denies questions at this time.

## 2018-01-01 NOTE — Anesthesia Post-op Follow-up Note (Signed)
  Anesthesia Pain Follow-up Note  Patient: [name]  Day #: 1  Date of Follow-up: 01/01/2018 Time: 9:49 AM  Last Vitals:  Vitals:   01/01/18 0618 01/01/18 0912  BP: (!) 97/52 (!) 102/55  Pulse: 67 67  Resp: 16 16  Temp: 36.7 C (!) 36.4 C  SpO2: 100% 100%    Level of Consciousness: alert  Pain: none   Side Effects:None  Catheter Site Exam:clean  Anti-Coag Meds (From admission, onward)   Start     Dose/Rate Route Frequency Ordered Stop   01/03/18 0000  rivaroxaban (XARELTO) 10 MG TABS tablet     10 mg Oral Daily 12/31/17 1017 01/17/18 2359   01/01/18 1600  rivaroxaban (XARELTO) tablet 10 mg     10 mg Oral Every 24 hours 12/31/17 1752      Epidural / Intrathecal (From admission, onward)   Start     Dose/Rate Route Frequency Ordered Stop   12/31/17 1800  ropivacaine (PF) 2 mg/mL (0.2%) (NAROPIN) injection     10 mL/hr 10 mL/hr  Epidural Continuous 12/31/17 1751 01/02/18 1759       Plan: D/C Infusion at surgeon's request  Trevor IhaStephen A Houser

## 2018-01-01 NOTE — Addendum Note (Signed)
Addendum  created 01/01/18 0951 by Trevor IhaHouser, Braeden Kennan A, MD   Sign clinical note

## 2018-01-02 LAB — BASIC METABOLIC PANEL
Anion gap: 6 (ref 5–15)
BUN: 17 mg/dL (ref 6–20)
CHLORIDE: 104 mmol/L (ref 101–111)
CO2: 28 mmol/L (ref 22–32)
Calcium: 8.6 mg/dL — ABNORMAL LOW (ref 8.9–10.3)
Creatinine, Ser: 0.63 mg/dL (ref 0.44–1.00)
GFR calc Af Amer: >60 mL/min (ref >60)
GFR calc non Af Amer: >60 mL/min (ref >60)
Glucose, Bld: 105 mg/dL — ABNORMAL HIGH (ref 65–99)
POTASSIUM: 4 mmol/L (ref 3.5–5.1)
SODIUM: 138 mmol/L (ref 135–145)

## 2018-01-02 LAB — CBC
HCT: 25.4 % — ABNORMAL LOW (ref 36.0–46.0)
Hemoglobin: 8.6 g/dL — ABNORMAL LOW (ref 12.0–15.0)
MCH: 31.4 pg (ref 26.0–34.0)
MCHC: 33.9 g/dL (ref 30.0–36.0)
MCV: 92.7 fL (ref 78.0–100.0)
Platelets: 233 10*3/uL (ref 150–400)
RBC: 2.74 MIL/uL — AB (ref 3.87–5.11)
RDW: 13.2 % (ref 11.5–15.5)
WBC: 10.3 10*3/uL (ref 4.0–10.5)

## 2018-01-02 NOTE — Progress Notes (Signed)
Received a call from the nurse stating patient has now will go straight to OP PT, already arranged. Cancelled HH PT with [facility]. [phone]

## 2018-01-02 NOTE — Progress Notes (Signed)
Physical Therapy Treatment Patient Details Name: [name] MRN: [id number] DOB: 1951/11/30 Today's Date: 01/02/2018    History of Present Illness Pt is a 67 year old female s/p bilateral TKAs    PT Comments    Pt ambulated in hallway again and practiced safe stair technique.  Answered pt's questions and pt feels ready for d/c home today.  Follow Up Recommendations  Home health PT     Equipment Recommendations  None recommended by PT    Recommendations for Other Services       Precautions / Restrictions Precautions Precautions: Fall;Knee Restrictions Other Position/Activity Restrictions: WBAT bilaterally    Mobility  Bed Mobility Overal bed mobility: Needs Assistance Bed Mobility: Supine to Sit     Supine to sit: Supervision     General bed mobility comments: pt up in recliner on arrival  Transfers Overall transfer level: Needs assistance Equipment used: Rolling walker (2 wheeled) Transfers: Sit to/from Stand Sit to Stand: Supervision Stand pivot transfers: Min guard       General transfer comment: close supervision for safety however pt able to perform without physical assist  Ambulation/Gait Ambulation/Gait assistance: Min guard Ambulation Distance (Feet): 200 Feet Assistive device: Rolling walker (2 wheeled) Gait Pattern/deviations: Step-through pattern;Decreased stride length;Trunk flexed     General Gait Details: verbal cues for technique, RW positioning   Stairs Stairs: Yes   Stair Management: Step to pattern;Forwards;Two rails Number of Stairs: 3 General stair comments: pt performed twice, wished to practice steps as she has television upstairs, verbal cues for safety, technique, sequence  Wheelchair Mobility    Modified Rankin (Stroke Patients Only)       Balance                                            Cognition Arousal/Alertness: Awake/alert Behavior During Therapy: WFL for tasks  assessed/performed Overall Cognitive Status: Within Functional Limits for tasks assessed                                        Exercises     General Comments        Pertinent Vitals/Pain Pain Assessment: 0-10 Pain Score: 2  Pain Location: bil knees Pain Descriptors / Indicators: Sore;Aching;Tightness Pain Intervention(s): Repositioned;Limited activity within patient's tolerance;Monitored during session    Home Living Family/patient expects to be discharged to:: Private residence Living Arrangements: Spouse/significant other Available Help at Discharge: Family;Friend(s);Available 24 hours/day Type of Home: House Home Access: (small threshold)   Home Layout: Able to live on main level with bedroom/bathroom Home Equipment: Walker - 2 wheels;Bedside commode      Prior Function Level of Independence: Independent          PT Goals (current goals can now be found in the care plan section) Acute Rehab PT Goals Patient Stated Goal: get back to what I enjoy Progress towards PT goals: Progressing toward goals    Frequency    7X/week      PT Plan Current plan remains appropriate    Co-evaluation              AM-PAC PT "6 Clicks" Daily Activity  Outcome Measure  Difficulty turning over in bed (including adjusting bedclothes, sheets and blankets)?: None Difficulty moving from lying on back to sitting on  the side of the bed? : A Little Difficulty sitting down on and standing up from a chair with arms (Hensley.g., wheelchair, bedside commode, etc,.)?: A Little Help needed moving to and from a bed to chair (including a wheelchair)?: A Little Help needed walking in hospital room?: A Little Help needed climbing 3-5 steps with a railing? : A Little 6 Click Score: 19    End of Session Equipment Utilized During Treatment: Gait belt Activity Tolerance: Patient tolerated treatment well Patient left: with call bell/phone within reach;with family/visitor  present;in chair Nurse Communication: Mobility status PT Visit Diagnosis: Other abnormalities of gait and mobility (R26.89)     Time: 1357-1406 PT Time Calculation (min) (ACUTE ONLY): 9 min  Charges:  $Gait Training: 8-22 mins                    G Codes:       [name], PT, DPT 01/02/2018 Pager: [phone]  [name],[name] 01/02/2018, 3:05 PM

## 2018-01-02 NOTE — Progress Notes (Signed)
Patient preferred to do outpatient PT vs homehealth PT. Spoke with Dr.Olin, he stated that was fine, set-up OUTPATIENT PT appt for Friday at 11:00am with Amy. Made patient aware. Made Case Manager aware to cancel HomeHealth PT.

## 2018-01-02 NOTE — Plan of Care (Signed)
Goals of care met.  Patient moving exceptionally well with minimal pain. 

## 2018-01-02 NOTE — Evaluation (Signed)
Occupational Therapy Evaluation Patient Details Name: [name] K Hickle MRN: [id number] DOB: 12-19-1950 Today's Date: 01/02/2018    History of Present Illness Pt is a 67 year old female s/p bilateral TKAs   Clinical Impression   OT education complete    Follow Up Recommendations  No OT follow up    Equipment Recommendations  None recommended by OT       Precautions / Restrictions Precautions Precautions: Fall;Knee Restrictions Weight Bearing Restrictions: No Other Position/Activity Restrictions: WBAT bilaterally      Mobility Bed Mobility Overal bed mobility: Needs Assistance Bed Mobility: Supine to Sit     Supine to sit: Supervision     General bed mobility comments: pt self assisted LEs onto bed  Transfers Overall transfer level: Needs assistance Equipment used: Rolling walker (2 wheeled) Transfers: Sit to/from UGI CorporationStand;Stand Pivot Transfers Sit to Stand: Min assist;Min guard Stand pivot transfers: Min guard                ADL either performed or assessed with clinical judgement   ADL Overall ADL's : Needs assistance/impaired                                       General ADL Comments: Pt overall S - min guard A with ADL activity. Pts has A as needed. Education completed regarding ADL activity s/p BTKR.     Vision Patient Visual Report: No change from baseline              Pertinent Vitals/Pain Pain Score: 2  Pain Location: bil knees Pain Descriptors / Indicators: Sore Pain Intervention(s): Limited activity within patient's tolerance;Monitored during session     Hand Dominance     Extremity/Trunk Assessment Upper Extremity Assessment Upper Extremity Assessment: Overall WFL for tasks assessed           Communication Communication Communication: No difficulties   Cognition Arousal/Alertness: Awake/alert Behavior During Therapy: WFL for tasks assessed/performed Overall Cognitive Status: Within Functional Limits for  tasks assessed                                                Home Living Family/patient expects to be discharged to:: Private residence Living Arrangements: Spouse/significant other Available Help at Discharge: Family;Friend(s);Available 24 hours/day Type of Home: House Home Access: (small threshold)     Home Layout: Able to live on main level with bedroom/bathroom     Bathroom Shower/Tub: Producer, television/film/videoWalk-in shower   Bathroom Toilet: Standard     Home Equipment: Environmental consultantWalker - 2 wheels;Bedside commode          Prior Functioning/Environment Level of Independence: Independent                          OT Goals(Current goals can be found in the care plan section) Acute Rehab OT Goals Patient Stated Goal: get back to what I enjoy OT Goal Formulation: With patient  OT Frequency:      AM-PAC PT "6 Clicks" Daily Activity     Outcome Measure Help from another person eating meals?: None Help from another person taking care of personal grooming?: None Help from another person toileting, which includes using toliet, bedpan, or urinal?: A Little Help from another person bathing (including washing, rinsing,  drying)?: A Little Help from another person to put on and taking off regular upper body clothing?: None Help from another person to put on and taking off regular lower body clothing?: None 6 Click Score: 22   End of Session Equipment Utilized During Treatment: Rolling walker  Activity Tolerance: Patient tolerated treatment well Patient left: in chair;with call bell/phone within reach;with family/visitor present                   Time: 1610-9604 OT Time Calculation (min): 28 min Charges:  OT General Charges $OT Visit: 1 Visit OT Evaluation $OT Eval Low Complexity: 1 Low OT Treatments $Self Care/Home Management : 8-22 mins G-Codes:     Lise Auer, OT [phone]  Einar Crow D 01/02/2018, 11:51 AM

## 2018-01-02 NOTE — Progress Notes (Signed)
Physical Therapy Treatment Patient Details Name: [name] MRN: [id number] DOB: [birth year] Today's Date: 01/02/2018    History of Present Illness Pt is a 67 year old female s/p bilateral TKAs    PT Comments    Pt ambulated in hallway and performed LE exercises.  Pt continues to progress very well, only reports a little more pain and soreness today.  Pt states she plans to d/c home today however would like a second session.  Also requests to try steps prior to d/c.     Follow Up Recommendations  Home health PT     Equipment Recommendations  None recommended by PT    Recommendations for Other Services       Precautions / Restrictions Precautions Precautions: Fall;Knee Restrictions Weight Bearing Restrictions: No Other Position/Activity Restrictions: WBAT bilaterally    Mobility  Bed Mobility Overal bed mobility: Needs Assistance Bed Mobility: Supine to Sit     Supine to sit: Supervision     General bed mobility comments: pt up in recliner on arrival  Transfers Overall transfer level: Needs assistance Equipment used: Rolling walker (2 wheeled) Transfers: Sit to/from Stand Sit to Stand: Min guard Stand pivot transfers: Min guard       General transfer comment: verbal cues for UE and LE positioning, min/guard for safety however pt able to perform without physical assist  Ambulation/Gait Ambulation/Gait assistance: Min guard Ambulation Distance (Feet): 160 Feet Assistive device: Rolling walker (2 wheeled) Gait Pattern/deviations: Step-through pattern;Decreased stride length;Trunk flexed     General Gait Details: verbal cues for technique, RW positioning   Stairs            Wheelchair Mobility    Modified Rankin (Stroke Patients Only)       Balance                                            Cognition Arousal/Alertness: Awake/alert Behavior During Therapy: WFL for tasks assessed/performed Overall Cognitive Status:  Within Functional Limits for tasks assessed                                        Exercises Total Joint Exercises Ankle Circles/Pumps: AROM;Both;10 reps Quad Sets: AROM;Both;10 reps Towel Squeeze: AROM;10 reps;Both Short Arc Quad: AROM;Both;10 reps Heel Slides: 10 reps;Both;AROM;Seated Hip ABduction/ADduction: AROM;Both;10 reps Straight Leg Raises: AROM;Both;10 reps    General Comments        Pertinent Vitals/Pain Pain Assessment: 0-10 Pain Score: 2  Pain Location: bil knees Pain Descriptors / Indicators: Sore;Aching;Tightness Pain Intervention(s): Limited activity within patient's tolerance;Repositioned;Monitored during session;Ice applied    Home Living Family/patient expects to be discharged to:: Private residence Living Arrangements: Spouse/significant other Available Help at Discharge: Family;Friend(s);Available 24 hours/day Type of Home: House Home Access: (small threshold)   Home Layout: Able to live on main level with bedroom/bathroom Home Equipment: Walker - 2 wheels;Bedside commode      Prior Function Level of Independence: Independent          PT Goals (current goals can now be found in the care plan section) Acute Rehab PT Goals Patient Stated Goal: get back to what I enjoy Progress towards PT goals: Progressing toward goals    Frequency    7X/week      PT Plan Current plan remains appropriate  Co-evaluation              AM-PAC PT "6 Clicks" Daily Activity  Outcome Measure  Difficulty turning over in bed (including adjusting bedclothes, sheets and blankets)?: None Difficulty moving from lying on back to sitting on the side of the bed? : A Little Difficulty sitting down on and standing up from a chair with arms (e.g., wheelchair, bedside commode, etc,.)?: A Little Help needed moving to and from a bed to chair (including a wheelchair)?: A Little Help needed walking in hospital room?: A Little Help needed climbing 3-5  steps with a railing? : A Little 6 Click Score: 19    End of Session   Activity Tolerance: Patient tolerated treatment well Patient left: with call bell/phone within reach;with family/visitor present;in chair Nurse Communication: Mobility status PT Visit Diagnosis: Other abnormalities of gait and mobility (R26.89)     Time: [id number] PT Time Calculation (min) (ACUTE ONLY): 23 min  Charges:  $Gait Training: 8-22 mins $Therapeutic Exercise: 8-22 mins                    G Codes:       Zenovia JarredKati Carrel Leather, PT, DPT 01/02/2018 Pager: [phone]  Maida SaleLEMYRE,KATHrine E 01/02/2018, 12:25 PM

## 2018-01-02 NOTE — Progress Notes (Signed)
     Subjective: 2 Days Post-Op Procedure(s) (LRB): TOTAL KNEE BILATERAL (Bilateral)   Patient reports pain as mild, pain controlled. No events throughout the night.  Looking forward to progressing and getting her life back.  Ready to be discharged home.  Objective:   VITALS:   Vitals:   01/01/18 2150 01/02/18 0557  BP: 114/60 (!) 99/53  Pulse: 66 72  Resp: 16 16  Temp: 98.1 F (36.7 C) 98.2 F (36.8 C)  SpO2: 100% 100%    Dorsiflexion/Plantar flexion intact Incision: dressing C/D/I No cellulitis present Compartment soft  LABS Recent Labs    01/01/18 0552 01/02/18 0535  HGB 10.2* 8.6*  HCT 30.1* 25.4*  WBC 13.4* 10.3  PLT 281 233    Recent Labs    01/01/18 0552 01/02/18 0535  NA 136 138  K 4.9 4.0  BUN 14 17  CREATININE 0.58 0.63  GLUCOSE 125* 105*     Assessment/Plan: 2 Days Post-Op Procedure(s) (LRB): TOTAL KNEE BILATERAL (Bilateral) Up with therapy Discharge home [facility] Follow up with OLIN,Savio Albrecht D in 2 weeks.  Contact information:  [facility] [address] [place] [id number] [phone]           Anastasio AuerbachMatthew S. Rayshawn Maney   PAC  01/02/2018, 9:12 AM

## 2018-01-08 NOTE — Discharge Summary (Signed)
Physician Discharge Summary  Patient ID: [name] MRN: [id number] DOB/AGE: 13-May-1951 66 y.o.  Admit date: 12/31/2017 Discharge date: 01/02/2018   Procedures:  Procedure(s) (LRB): TOTAL KNEE BILATERAL (Bilateral)  Attending Physician:  Dr. Durene Romans   Admission Diagnoses:   Bilateral knee primary OA / pain  Discharge Diagnoses:  Principal Problem:   S/P bilateral TKAs Active Problems:   S/P total knee replacement   Status post bilateral knee replacements  Past Medical History:  Diagnosis Date  . Arthritis   . Headache    hx of    HPI:     [name], 67 y.o. female, has a history of pain and functional disability in the bilaterally knee due to arthritis and has failed non-surgical conservative treatments for greater than 12 weeks to include NSAID's and/or analgesics, corticosteriod injections, viscosupplementation injections and activity modification.  Onset of symptoms was gradual, starting years ago with gradually worsening course since that time. The patient noted prior procedures on the knee to include  bursectomy on the right knee(s).  Patient currently rates pain in the bilaterally knee(s) at 7 out of 10 with activity. Patient has worsening of pain with activity and weight bearing, pain that interferes with activities of daily living, pain with passive range of motion, crepitus and joint swelling.  Patient has evidence of periarticular osteophytes and joint space narrowing by imaging studies.  There is no active infection.   Risks, benefits and expectations were discussed with the patient.  Risks including but not limited to the risk of anesthesia, blood clots, nerve damage, blood vessel damage, failure of the prosthesis, infection and up to and including death.  Patient understand the risks, benefits and expectations and wishes to proceed with surgery.   PCP: Daisy Floro, MD   Discharged Condition: good  Hospital Course:  Patient underwent the above  stated procedure on 12/31/2017. Patient tolerated the procedure well and brought to the recovery room in good condition and subsequently to the floor.  POD #1 BP: 102/55 ; Pulse: 67 ; Temp: 97.5 F (36.4 C) ; Resp: 16 Patient reports pain as mild as she has epidural catheter in place.  No events but not much sleep. Neurovascular intact and incision: dressing C/D/I.   LABS  Basename    HGB     10.2  HCT     30.1   POD #2  BP: 99/53 ; Pulse: 72 ; Temp: 98.2 F (36.8 C) ; Resp: 16 Patient reports pain as mild, pain controlled. No events throughout the night.  Looking forward to progressing and getting her life back.  Ready to be discharged home. Dorsiflexion/plantar flexion intact, incision: dressing C/D/I, no cellulitis present and compartment soft.   LABS  Basename    HGB     8.6  HCT     25.4    Discharge Exam: General appearance: alert, cooperative and no distress Extremities: Homans sign is negative, no sign of DVT, no edema, redness or tenderness in the calves or thighs and no ulcers, gangrene or trophic changes  Disposition: Home with follow up in 2 weeks   Follow-up Information    Durene Romans, MD. Schedule an appointment as soon as possible for a visit in 2 week(s).   Specialty:  Orthopedic Surgery Contact information: [address] [place] [address] [phone]           Discharge Instructions    Call MD / Call 911   Complete by:  As directed  If you experience chest pain or shortness of breath, CALL 911 and be transported to the hospital emergency room.  If you develope a fever above 101 F, pus (white drainage) or increased drainage or redness at the wound, or calf pain, call your surgeon's office.   Change dressing   Complete by:  As directed    Maintain surgical dressing until follow up in the clinic. If the edges start to pull up, may reinforce with tape. If the dressing is no longer working, may remove and cover with gauze and  tape, but must keep the area dry and clean.  Call with any questions or concerns.   Constipation Prevention   Complete by:  As directed    Drink plenty of fluids.  Prune juice may be helpful.  You may use a stool softener, such as Colace (over the counter) 100 mg twice a day.  Use MiraLax (over the counter) for constipation as needed.   Diet - low sodium heart healthy   Complete by:  As directed    Discharge instructions   Complete by:  As directed    Maintain surgical dressing until follow up in the clinic. If the edges start to pull up, may reinforce with tape. If the dressing is no longer working, may remove and cover with gauze and tape, but must keep the area dry and clean.  Follow up in 2 weeks at [facility]. Call with any questions or concerns.   Increase activity slowly as tolerated   Complete by:  As directed    Weight bearing as tolerated with assist device (walker, cane, etc) as directed, use it as long as suggested by your surgeon or therapist, typically at least 4-6 weeks.   TED hose   Complete by:  As directed    Use stockings (TED hose) for 2 weeks on both leg(s).  You may remove them at night for sleeping.      Allergies as of 01/02/2018   No Known Allergies     Medication List    STOP taking these medications   aspirin EC 81 MG tablet Replaced by:  aspirin 81 MG chewable tablet   ibuprofen 200 MG tablet Commonly known as:  ADVIL,MOTRIN   meloxicam 7.5 MG tablet Commonly known as:  MOBIC   naproxen sodium 220 MG tablet Commonly known as:  ALEVE   Turmeric 500 MG Tabs     TAKE these medications   acetaminophen 500 MG tablet Commonly known as:  TYLENOL Take 2 tablets (1,000 mg total) by mouth every 8 (eight) hours.   aspirin 81 MG chewable tablet Commonly known as:  ASPIRIN CHILDRENS Chew 1 tablet (81 mg total) by mouth 2 (two) times daily. Start the day after finishing the Xarelto. Take for 4 weeks. Start taking on:  01/18/2018 Replaces:   aspirin EC 81 MG tablet   Biotin 10 MG Tabs Take 10 mg by mouth daily.   bisacodyl 5 MG EC tablet Commonly known as:  DULCOLAX Take 5 mg by mouth daily.   CALCIUM 1200 PO Take 1,200 mg by mouth daily.   Cinnamon 500 MG Tabs Take 1,000 mg by mouth daily.   docusate sodium 100 MG capsule Commonly known as:  COLACE Take 1 capsule (100 mg total) by mouth 2 (two) times daily.   ferrous sulfate 325 (65 FE) MG tablet Commonly known as:  FERROUSUL Take 1 tablet (325 mg total) by mouth 3 (three) times daily with meals. What changed:  when to  take this   GLUCOSAMINE PO Take 1 tablet by mouth daily.   Magnesium 250 MG Tabs Take 250 mg by mouth daily.   methocarbamol 500 MG tablet Commonly known as:  ROBAXIN Take 1 tablet (500 mg total) by mouth every 6 (six) hours as needed for muscle spasms.   MULTIVITAMIN PO Take 1 tablet by mouth daily.   oxyCODONE 5 MG immediate release tablet Commonly known as:  Oxy IR/ROXICODONE Take 1-2 tablets (5-10 mg total) by mouth every 4 (four) hours as needed for moderate pain or severe pain.   polyethylene glycol packet Commonly known as:  MIRALAX / GLYCOLAX Take 17 g by mouth 2 (two) times daily.   pyridOXINE 100 MG tablet Commonly known as:  VITAMIN B-6 Take 100 mg by mouth daily.   rivaroxaban 10 MG Tabs tablet Commonly known as:  XARELTO Take 1 tablet (10 mg total) by mouth daily for 14 days.   Vitamin D3 1000 units Caps Take 1,000 Units by mouth daily.            Discharge Care Instructions  (From admission, onward)        Start     Ordered   01/02/18 0000  Change dressing    Comments:  Maintain surgical dressing until follow up in the clinic. If the edges start to pull up, may reinforce with tape. If the dressing is no longer working, may remove and cover with gauze and tape, but must keep the area dry and clean.  Call with any questions or concerns.   01/02/18 0912       Signed: Anastasio AuerbachMatthew S. Demond Shallenberger    PA-C  01/08/2018, 9:55 AM

## 2018-02-21 ENCOUNTER — Ambulatory Visit
Admission: RE | Admit: 2018-02-21 | Discharge: 2018-02-21 | Disposition: A | Discharge status: Home / Self Care | Payer: Non-veteran care | Source: Ambulatory Visit | Attending: Orthopedic Surgery | Admitting: Orthopedic Surgery

## 2018-02-21 ENCOUNTER — Other Ambulatory Visit: Payer: Self-pay | Admitting: Orthopedic Surgery

## 2018-02-21 DIAGNOSIS — Z96652 Presence of left artificial knee joint: Secondary | ICD-10-CM

## 2018-02-25 ENCOUNTER — Other Ambulatory Visit: Payer: Self-pay

## 2018-02-25 NOTE — Progress Notes (Signed)
Surgery on 02/28/2018.  Need orders in epic. Thank You

## 2018-02-26 ENCOUNTER — Other Ambulatory Visit: Payer: Self-pay | Admitting: Orthopedic Surgery

## 2018-02-26 DIAGNOSIS — M9712XA Periprosthetic fracture around internal prosthetic left knee joint, initial encounter: Secondary | ICD-10-CM

## 2018-02-27 ENCOUNTER — Ambulatory Visit
Admission: RE | Admit: 2018-02-27 | Discharge: 2018-02-27 | Disposition: A | Discharge status: Home / Self Care | Payer: Non-veteran care | Source: Ambulatory Visit | Attending: Orthopedic Surgery | Admitting: Orthopedic Surgery

## 2018-02-27 ENCOUNTER — Other Ambulatory Visit: Payer: Self-pay | Admitting: Orthopedic Surgery

## 2018-02-27 DIAGNOSIS — M9712XA Periprosthetic fracture around internal prosthetic left knee joint, initial encounter: Secondary | ICD-10-CM

## 2018-02-27 NOTE — H&P (Signed)
[name] is an 67 y.o. female.    Chief Complaint: Left distal femur periprosthetic fracture   Procedure:    ORIF left distal femur fracture  HPI: Pt is a 67 y.o. female complaining of left distal femur pain for a couple of weeks. Pain had continually increased since the beginning. X-rays in the clinic show some cortical changes in the left distal femur. Pt has tried various conservative treatments which have failed to alleviate their symptoms, including limited WB and activity modification. Various options are discussed with the patient.  Patient is s/p bilateral total knee arthroplasties multiple weeks ago. Risks, benefits and expectations were discussed with the patient. Patient understand the risks, benefits and expectations and wishes to proceed with surgery.    PCP: Daisy FloroWymer, Antoinette, MD  D/C Plans:       Home  Post-op Meds:       No Rx given   Tranexamic Acid:      To be given - IV   Decadron:      Is to be given  FYI:     ASA  Norco or Oxycodone  DME:   Pt already has equipment    PMH: Past Medical History:  Diagnosis Date  . Arthritis   . Headache    hx of    PSH: Past Surgical History:  Procedure Laterality Date  . APPENDECTOMY    . CARPAL TUNNEL RELEASE    . EYE SURGERY     lasik eye   . HERNIA REPAIR    . JOINT REPLACEMENT     bilateral tka   12-31-17 Dr. Charlann Boxerlin  . OLECRANON BURSECTOMY    . TONSILLECTOMY    . TOTAL KNEE ARTHROPLASTY Bilateral 12/31/2017   Procedure: TOTAL KNEE BILATERAL;  Surgeon: Durene Romanslin, Izack Hoogland, MD;  Location: [facility];  Service: Orthopedics;  Laterality: Bilateral;  . TUBAL LIGATION      Social History:  reports that  has never smoked. she has never used smokeless tobacco. She reports that she drinks alcohol. She reports that she does not use drugs.  Allergies:  No Known Allergies  Medications: No current facility-administered medications for this encounter.    Current Outpatient Medications  Medication Sig Dispense  Refill  . acetaminophen (TYLENOL) 500 MG tablet Take 2 tablets (1,000 mg total) by mouth every 8 (eight) hours. (Patient taking differently: Take 1,000 mg by mouth every 6 (six) hours. ) 30 tablet 0  . bisacodyl (DULCOLAX) 5 MG EC tablet Take 5 mg by mouth daily as needed for mild constipation or moderate constipation.     . methocarbamol (ROBAXIN) 500 MG tablet Take 1 tablet (500 mg total) by mouth every 6 (six) hours as needed for muscle spasms. (Patient taking differently: Take 500 mg by mouth every 8 (eight) hours as needed for muscle spasms. ) 40 tablet 0  . oxyCODONE (OXY IR/ROXICODONE) 5 MG immediate release tablet Take 1-2 tablets (5-10 mg total) by mouth every 4 (four) hours as needed for moderate pain or severe pain. (Patient taking differently: Take 5 mg by mouth every 4 (four) hours as needed for moderate pain or severe pain. ) 60 tablet 0  . polyethylene glycol (MIRALAX / GLYCOLAX) packet Take 17 g by mouth 2 (two) times daily. (Patient taking differently: Take 17 g by mouth daily as needed for moderate constipation. ) 14 each 0  . aspirin 81 MG chewable tablet Chew 81 mg by mouth 2 (two) times daily.    . Biotin 10  MG TABS Take 10 mg by mouth daily.    . Calcium Carbonate-Vit D-Min (CALCIUM 1200 PO) Take 1,200 mg by mouth daily.    . Cholecalciferol (VITAMIN D3) 1000 units CAPS Take 1,000 Units by mouth daily.    Marland Kitchen CINNAMON PO Take 1,000 mg by mouth daily.    Marland Kitchen docusate sodium (COLACE) 100 MG capsule Take 1 capsule (100 mg total) by mouth 2 (two) times daily. (Patient not taking: Reported on 02/25/2018) 10 capsule 0  . ferrous sulfate (FERROUSUL) 325 (65 FE) MG tablet Take 1 tablet (325 mg total) by mouth 3 (three) times daily with meals. (Patient not taking: Reported on 02/25/2018)  3  . Glucosamine HCl (GLUCOSAMINE PO) Take 1,000 mg by mouth daily.     Marland Kitchen ibuprofen (ADVIL,MOTRIN) 800 MG tablet Take 800 mg by mouth every 8 (eight) hours as needed for moderate pain.    . Magnesium 250 MG  TABS Take 250 mg by mouth daily.    . meloxicam (MOBIC) 7.5 MG tablet Take 7.5 mg by mouth 3 (three) times daily as needed for pain. Take with meals    . Multiple Vitamins-Minerals (MULTIVITAMIN PO) Take 1 tablet by mouth daily.    . naproxen sodium (ALEVE) 220 MG tablet Take 220-440 mg by mouth See admin instructions. Take 440 mg as first dose may take an additional 220 mg later as needed pain    . pyridOXINE (VITAMIN B-6) 100 MG tablet Take 100 mg by mouth daily.    . rivaroxaban (XARELTO) 10 MG TABS tablet Take 1 tablet (10 mg total) by mouth daily for 14 days. (Patient not taking: Reported on 02/25/2018) 14 tablet 0  . Turmeric 500 MG TABS Take 500 mg by mouth daily.        Review of Systems  Constitutional: Negative.   HENT: Negative.   Eyes: Negative.   Respiratory: Negative.   Cardiovascular: Negative.   Gastrointestinal: Negative.   Genitourinary: Negative.   Musculoskeletal: Positive for joint pain.  Skin: Negative.   Neurological: Positive for headaches.  Endo/Heme/Allergies: Negative.   Psychiatric/Behavioral: Negative.        Physical Exam  Constitutional: She is oriented to person, place, and time. She appears well-developed.  HENT:  Head: Normocephalic.  Eyes: Pupils are equal, round, and reactive to light.  Neck: Neck supple. No JVD present. No tracheal deviation present. No thyromegaly present.  Cardiovascular: Normal rate, regular rhythm and intact distal pulses.  Respiratory: Effort normal and breath sounds normal. No respiratory distress. She has no wheezes.  GI: Soft. There is no tenderness. There is no guarding.  Musculoskeletal:       Left knee: She exhibits decreased range of motion and swelling. She exhibits no ecchymosis, no deformity, no laceration and no erythema. Tenderness found.  Lymphadenopathy:    She has no cervical adenopathy.  Neurological: She is alert and oriented to person, place, and time.  Skin: Skin is warm and dry.  Psychiatric: She  has a normal mood and affect.       Assessment/Plan Assessment:   Left distal femur periprosthetic fracture  Plan: Patient will undergo an ORIF left distal femur fracture on 02/28/2018 per Dr. Charlann Boxer at [facility]. Risks benefits and expectations were discussed with the patient. Patient understand risks, benefits and expectations and wishes to proceed.   Anastasio Auerbach Jonmarc Bodkin   PA-C  02/27/2018, 12:04 PM

## 2018-02-28 ENCOUNTER — Ambulatory Visit (HOSPITAL_COMMUNITY): Payer: No Typology Code available for payment source

## 2018-02-28 ENCOUNTER — Encounter (HOSPITAL_COMMUNITY): Payer: Self-pay | Admitting: *Deleted

## 2018-02-28 ENCOUNTER — Encounter (HOSPITAL_COMMUNITY)
Admission: RE | Disposition: A | Discharge status: Home / Self Care | Payer: Self-pay | Source: Home / Self Care | Attending: Orthopedic Surgery

## 2018-02-28 ENCOUNTER — Ambulatory Visit (HOSPITAL_COMMUNITY): Payer: No Typology Code available for payment source | Admitting: Certified Registered Nurse Anesthetist

## 2018-02-28 ENCOUNTER — Other Ambulatory Visit: Payer: Self-pay

## 2018-02-28 ENCOUNTER — Inpatient Hospital Stay (HOSPITAL_COMMUNITY)
Admission: RE | Admit: 2018-02-28 | Discharge: 2018-03-01 | DRG: 481 | Disposition: A | Discharge status: Home / Self Care | Payer: No Typology Code available for payment source | Attending: Orthopedic Surgery | Admitting: Orthopedic Surgery

## 2018-02-28 DIAGNOSIS — M9712XA Periprosthetic fracture around internal prosthetic left knee joint, initial encounter: Principal | ICD-10-CM | POA: Diagnosis present

## 2018-02-28 DIAGNOSIS — X58XXXA Exposure to other specified factors, initial encounter: Secondary | ICD-10-CM | POA: Diagnosis present

## 2018-02-28 DIAGNOSIS — Z7982 Long term (current) use of aspirin: Secondary | ICD-10-CM

## 2018-02-28 DIAGNOSIS — S72402A Unspecified fracture of lower end of left femur, initial encounter for closed fracture: Secondary | ICD-10-CM | POA: Diagnosis not present

## 2018-02-28 DIAGNOSIS — M25562 Pain in left knee: Secondary | ICD-10-CM | POA: Diagnosis present

## 2018-02-28 DIAGNOSIS — M1990 Unspecified osteoarthritis, unspecified site: Secondary | ICD-10-CM | POA: Diagnosis not present

## 2018-02-28 DIAGNOSIS — Z79899 Other long term (current) drug therapy: Secondary | ICD-10-CM

## 2018-02-28 DIAGNOSIS — Z96653 Presence of artificial knee joint, bilateral: Secondary | ICD-10-CM | POA: Diagnosis not present

## 2018-02-28 HISTORY — PX: ORIF FEMUR FRACTURE: SHX2119

## 2018-02-28 LAB — CBC
HCT: 37.7 % (ref 36.0–46.0)
HEMOGLOBIN: 12.4 g/dL (ref 12.0–15.0)
MCH: 30 pg (ref 26.0–34.0)
MCHC: 32.9 g/dL (ref 30.0–36.0)
MCV: 91.3 fL (ref 78.0–100.0)
Platelets: 365 10*3/uL (ref 150–400)
RBC: 4.13 MIL/uL (ref 3.87–5.11)
RDW: 13 % (ref 11.5–15.5)
WBC: 6.7 10*3/uL (ref 4.0–10.5)

## 2018-02-28 LAB — TYPE AND SCREEN
ABO/RH(D): A POS
Antibody Screen: NEGATIVE

## 2018-02-28 LAB — MRSA PCR SCREENING: MRSA BY PCR: NEGATIVE

## 2018-02-28 SURGERY — OPEN REDUCTION INTERNAL FIXATION (ORIF) DISTAL FEMUR FRACTURE
Anesthesia: Spinal | Laterality: Left

## 2018-02-28 MED ORDER — VANCOMYCIN HCL IN DEXTROSE 1-5 GM/200ML-% IV SOLN
1000.0000 mg | INTRAVENOUS | Status: AC
  Administered 2018-02-28: 1000 mg via INTRAVENOUS
  Filled 2018-02-28: qty 200

## 2018-02-28 MED ORDER — FENTANYL CITRATE (PF) 250 MCG/5ML IJ SOLN
INTRAMUSCULAR | Status: AC
  Filled 2018-02-28: qty 5

## 2018-02-28 MED ORDER — FENTANYL CITRATE (PF) 100 MCG/2ML IJ SOLN
INTRAMUSCULAR | Status: AC
  Filled 2018-02-28: qty 2

## 2018-02-28 MED ORDER — PHENYLEPHRINE HCL 10 MG/ML IJ SOLN
INTRAMUSCULAR | Status: AC
  Filled 2018-02-28: qty 1

## 2018-02-28 MED ORDER — SODIUM CHLORIDE 0.9 % IR SOLN
Status: DC | PRN
  Administered 2018-02-28: 1000 mL

## 2018-02-28 MED ORDER — HYDROMORPHONE HCL 1 MG/ML IJ SOLN
1.0000 mg | INTRAMUSCULAR | Status: DC | PRN
  Administered 2018-02-28 – 2018-03-01 (×3): 1 mg via INTRAVENOUS
  Filled 2018-02-28 (×3): qty 1

## 2018-02-28 MED ORDER — PROPOFOL 500 MG/50ML IV EMUL
INTRAVENOUS | Status: DC | PRN
  Administered 2018-02-28: 50 ug/kg/min via INTRAVENOUS

## 2018-02-28 MED ORDER — HYDROMORPHONE HCL 1 MG/ML IJ SOLN
0.5000 mg | INTRAMUSCULAR | Status: DC | PRN
  Administered 2018-02-28 (×2): 1 mg via INTRAVENOUS
  Filled 2018-02-28 (×2): qty 1

## 2018-02-28 MED ORDER — CHLORHEXIDINE GLUCONATE 4 % EX LIQD: 60.0000 mL | Freq: Once | CUTANEOUS | Status: DC

## 2018-02-28 MED ORDER — ONDANSETRON HCL 4 MG/2ML IJ SOLN
INTRAMUSCULAR | Status: DC | PRN
  Administered 2018-02-28: 4 mg via INTRAVENOUS

## 2018-02-28 MED ORDER — CEFAZOLIN SODIUM-DEXTROSE 2-4 GM/100ML-% IV SOLN
2.0000 g | INTRAVENOUS | Status: AC
  Administered 2018-02-28: 2 g via INTRAVENOUS
  Filled 2018-02-28: qty 100

## 2018-02-28 MED ORDER — FENTANYL CITRATE (PF) 100 MCG/2ML IJ SOLN
INTRAMUSCULAR | Status: DC | PRN
  Administered 2018-02-28 (×2): 50 ug via INTRAVENOUS

## 2018-02-28 MED ORDER — POLYETHYLENE GLYCOL 3350 17 G PO PACK
17.0000 g | PACK | Freq: Two times a day (BID) | ORAL | Status: DC
  Administered 2018-03-01: 09:00:00 17 g via ORAL
  Filled 2018-02-28 (×2): qty 1

## 2018-02-28 MED ORDER — PHENYLEPHRINE HCL 10 MG/ML IJ SOLN
INTRAVENOUS | Status: DC | PRN
  Administered 2018-02-28: 40 ug/min via INTRAVENOUS

## 2018-02-28 MED ORDER — MAGNESIUM CITRATE PO SOLN: 1.0000 | Freq: Once | ORAL | Status: DC | PRN

## 2018-02-28 MED ORDER — DOCUSATE SODIUM 100 MG PO CAPS
100.0000 mg | ORAL_CAPSULE | Freq: Two times a day (BID) | ORAL | Status: DC
  Administered 2018-02-28 – 2018-03-01 (×2): 100 mg via ORAL
  Filled 2018-02-28 (×2): qty 1

## 2018-02-28 MED ORDER — METHOCARBAMOL 1000 MG/10ML IJ SOLN
500.0000 mg | Freq: Four times a day (QID) | INTRAVENOUS | Status: DC | PRN
  Administered 2018-02-28: 500 mg via INTRAVENOUS
  Filled 2018-02-28: qty 550

## 2018-02-28 MED ORDER — FERROUS SULFATE 325 (65 FE) MG PO TABS
325.0000 mg | ORAL_TABLET | Freq: Three times a day (TID) | ORAL | Status: DC
  Administered 2018-03-01 (×2): 325 mg via ORAL
  Filled 2018-02-28 (×2): qty 1

## 2018-02-28 MED ORDER — CELECOXIB 200 MG PO CAPS
200.0000 mg | ORAL_CAPSULE | Freq: Two times a day (BID) | ORAL | Status: DC
  Administered 2018-02-28 – 2018-03-01 (×2): 200 mg via ORAL
  Filled 2018-02-28 (×2): qty 1

## 2018-02-28 MED ORDER — CEFAZOLIN SODIUM-DEXTROSE 2-4 GM/100ML-% IV SOLN
2.0000 g | Freq: Four times a day (QID) | INTRAVENOUS | Status: AC
  Administered 2018-02-28 – 2018-03-01 (×2): 2 g via INTRAVENOUS
  Filled 2018-02-28 (×2): qty 100

## 2018-02-28 MED ORDER — ONDANSETRON HCL 4 MG/2ML IJ SOLN
INTRAMUSCULAR | Status: AC
  Filled 2018-02-28: qty 2

## 2018-02-28 MED ORDER — ASPIRIN EC 325 MG PO TBEC
325.0000 mg | DELAYED_RELEASE_TABLET | Freq: Two times a day (BID) | ORAL | Status: DC
  Administered 2018-03-01: 09:00:00 325 mg via ORAL
  Filled 2018-02-28: qty 1

## 2018-02-28 MED ORDER — ONDANSETRON HCL 4 MG PO TABS: 4.0000 mg | ORAL_TABLET | Freq: Four times a day (QID) | ORAL | Status: DC | PRN

## 2018-02-28 MED ORDER — OXYCODONE HCL 5 MG PO TABS
10.0000 mg | ORAL_TABLET | ORAL | Status: DC | PRN
  Administered 2018-02-28 (×2): 15 mg via ORAL
  Filled 2018-02-28: qty 3
  Filled 2018-02-28: qty 2
  Filled 2018-02-28: qty 3

## 2018-02-28 MED ORDER — MIDAZOLAM HCL 5 MG/5ML IJ SOLN
INTRAMUSCULAR | Status: DC | PRN
  Administered 2018-02-28: 2 mg via INTRAVENOUS

## 2018-02-28 MED ORDER — PROPOFOL 10 MG/ML IV BOLUS
INTRAVENOUS | Status: AC
  Filled 2018-02-28: qty 60

## 2018-02-28 MED ORDER — ONDANSETRON HCL 4 MG/2ML IJ SOLN: 4.0000 mg | Freq: Four times a day (QID) | INTRAMUSCULAR | Status: DC | PRN

## 2018-02-28 MED ORDER — SODIUM CHLORIDE 0.9 % IV SOLN
INTRAVENOUS | Status: DC
  Administered 2018-02-28: 17:00:00 via INTRAVENOUS

## 2018-02-28 MED ORDER — LIDOCAINE 2% (20 MG/ML) 5 ML SYRINGE
INTRAMUSCULAR | Status: DC | PRN
  Administered 2018-02-28: 50 mg via INTRAVENOUS

## 2018-02-28 MED ORDER — ZOLPIDEM TARTRATE 5 MG PO TABS
5.0000 mg | ORAL_TABLET | Freq: Every evening | ORAL | Status: DC | PRN
  Administered 2018-02-28: 5 mg via ORAL
  Filled 2018-02-28: qty 1

## 2018-02-28 MED ORDER — MIDAZOLAM HCL 2 MG/2ML IJ SOLN
INTRAMUSCULAR | Status: AC
  Filled 2018-02-28: qty 2

## 2018-02-28 MED ORDER — LIDOCAINE HCL (PF) 1 % IJ SOLN
INTRAMUSCULAR | Status: DC | PRN
  Administered 2018-02-28: 3 mL

## 2018-02-28 MED ORDER — OXYCODONE HCL 5 MG PO TABS
5.0000 mg | ORAL_TABLET | ORAL | Status: DC | PRN
  Administered 2018-03-01 (×2): 10 mg via ORAL
  Filled 2018-02-28 (×2): qty 2

## 2018-02-28 MED ORDER — LACTATED RINGERS IV SOLN
INTRAVENOUS | Status: DC
  Administered 2018-02-28 (×2): via INTRAVENOUS

## 2018-02-28 MED ORDER — BUPIVACAINE IN DEXTROSE 0.75-8.25 % IT SOLN
INTRATHECAL | Status: DC | PRN
  Administered 2018-02-28: 1.8 mL via INTRATHECAL

## 2018-02-28 MED ORDER — TRANEXAMIC ACID 1000 MG/10ML IV SOLN
1000.0000 mg | INTRAVENOUS | Status: AC
  Administered 2018-02-28: 1000 mg via INTRAVENOUS
  Filled 2018-02-28: qty 1100

## 2018-02-28 MED ORDER — PROPOFOL 10 MG/ML IV BOLUS
INTRAVENOUS | Status: AC
  Filled 2018-02-28: qty 20

## 2018-02-28 MED ORDER — METOCLOPRAMIDE HCL 5 MG PO TABS: 5.0000 mg | ORAL_TABLET | Freq: Three times a day (TID) | ORAL | Status: DC | PRN

## 2018-02-28 MED ORDER — METHYLPREDNISOLONE ACETATE 40 MG/ML IJ SUSP
INTRAMUSCULAR | Status: DC | PRN
  Administered 2018-02-28: 80 mg via INTRAMUSCULAR

## 2018-02-28 MED ORDER — BISACODYL 10 MG RE SUPP: 10.0000 mg | Freq: Every day | RECTAL | Status: DC | PRN

## 2018-02-28 MED ORDER — FENTANYL CITRATE (PF) 100 MCG/2ML IJ SOLN
INTRAMUSCULAR | Status: AC
  Administered 2018-02-28: 50 ug via INTRAVENOUS
  Filled 2018-02-28: qty 2

## 2018-02-28 MED ORDER — METOCLOPRAMIDE HCL 5 MG/ML IJ SOLN: 5.0000 mg | Freq: Three times a day (TID) | INTRAMUSCULAR | Status: DC | PRN

## 2018-02-28 MED ORDER — DEXAMETHASONE SODIUM PHOSPHATE 10 MG/ML IJ SOLN
INTRAMUSCULAR | Status: AC
  Filled 2018-02-28: qty 1

## 2018-02-28 MED ORDER — METHYLPREDNISOLONE ACETATE 40 MG/ML IJ SUSP
INTRAMUSCULAR | Status: AC
  Filled 2018-02-28: qty 2

## 2018-02-28 MED ORDER — DEXAMETHASONE SODIUM PHOSPHATE 10 MG/ML IJ SOLN
10.0000 mg | Freq: Once | INTRAMUSCULAR | Status: AC
  Administered 2018-02-28: 10 mg via INTRAVENOUS

## 2018-02-28 MED ORDER — HYDROMORPHONE HCL 1 MG/ML IJ SOLN: 0.2500 mg | INTRAMUSCULAR | Status: DC | PRN

## 2018-02-28 MED ORDER — LIDOCAINE 2% (20 MG/ML) 5 ML SYRINGE
INTRAMUSCULAR | Status: AC
  Filled 2018-02-28: qty 5

## 2018-02-28 MED ORDER — LIDOCAINE HCL (PF) 1 % IJ SOLN
INTRAMUSCULAR | Status: AC
  Filled 2018-02-28: qty 30

## 2018-02-28 MED ORDER — METHOCARBAMOL 500 MG PO TABS
500.0000 mg | ORAL_TABLET | Freq: Four times a day (QID) | ORAL | Status: DC | PRN
  Administered 2018-02-28 – 2018-03-01 (×2): 500 mg via ORAL
  Filled 2018-02-28 (×2): qty 1

## 2018-02-28 MED ORDER — PHENOL 1.4 % MT LIQD: 1.0000 | OROMUCOSAL | Status: DC | PRN

## 2018-02-28 MED ORDER — ALUM & MAG HYDROXIDE-SIMETH 200-200-20 MG/5ML PO SUSP: 30.0000 mL | ORAL | Status: DC | PRN

## 2018-02-28 MED ORDER — ACETAMINOPHEN 325 MG PO TABS: 325.0000 mg | ORAL_TABLET | Freq: Four times a day (QID) | ORAL | Status: DC | PRN

## 2018-02-28 MED ORDER — MENTHOL 3 MG MT LOZG: 1.0000 | LOZENGE | OROMUCOSAL | Status: DC | PRN

## 2018-02-28 MED ORDER — FENTANYL CITRATE (PF) 100 MCG/2ML IJ SOLN
25.0000 ug | INTRAMUSCULAR | Status: DC | PRN
  Administered 2018-02-28 (×3): 50 ug via INTRAVENOUS

## 2018-02-28 SURGICAL SUPPLY — 68 items
ADH SKN CLS APL DERMABOND .7 (GAUZE/BANDAGES/DRESSINGS) ×1
BAG SPEC THK2 15X12 ZIP CLS (MISCELLANEOUS) ×1
BAG ZIPLOCK 12X15 (MISCELLANEOUS) ×3 IMPLANT
BANDAGE ACE 6X5 VEL STRL LF (GAUZE/BANDAGES/DRESSINGS) ×6 IMPLANT
BIT DRILL 4.3 (BIT) ×2
BIT DRILL 4.3MM (BIT) ×1
BIT DRILL 4.3X300MM (BIT) IMPLANT
BIT DRILL QC 3.3X195 (BIT) ×2 IMPLANT
BLADE SAW SGTL 11.0X1.19X90.0M (BLADE) IMPLANT
BNDG COHESIVE 4X5 TAN STRL (GAUZE/BANDAGES/DRESSINGS) ×3 IMPLANT
BNDG GAUZE ELAST 4 BULKY (GAUZE/BANDAGES/DRESSINGS) ×1 IMPLANT
CAP LOCK NCB (Cap) ×12 IMPLANT
CLOSURE WOUND 1/2 X4 (GAUZE/BANDAGES/DRESSINGS)
COVER SURGICAL LIGHT HANDLE (MISCELLANEOUS) ×3 IMPLANT
DERMABOND ADVANCED (GAUZE/BANDAGES/DRESSINGS) ×2
DERMABOND ADVANCED .7 DNX12 (GAUZE/BANDAGES/DRESSINGS) IMPLANT
DRAPE C-ARM 42X120 X-RAY (DRAPES) ×3 IMPLANT
DRAPE C-ARMOR (DRAPES) ×3 IMPLANT
DRAPE EXTREMITY T 121X128X90 (DRAPE) ×2 IMPLANT
DRAPE ORTHO SPLIT 77X108 STRL (DRAPES)
DRAPE SURG ORHT 6 SPLT 77X108 (DRAPES) IMPLANT
DRAPE TOP 10253 STERILE (DRAPES) ×2 IMPLANT
DRSG AQUACEL AG ADV 3.5X 6 (GAUZE/BANDAGES/DRESSINGS) ×2 IMPLANT
DRSG AQUACEL AG ADV 3.5X10 (GAUZE/BANDAGES/DRESSINGS) ×2 IMPLANT
DRSG EMULSION OIL 3X16 NADH (GAUZE/BANDAGES/DRESSINGS) ×1 IMPLANT
DURAPREP 26ML APPLICATOR (WOUND CARE) ×3 IMPLANT
ELECT REM PT RETURN 15FT ADLT (MISCELLANEOUS) ×3 IMPLANT
GAUZE SPONGE 4X4 12PLY STRL (GAUZE/BANDAGES/DRESSINGS) ×1 IMPLANT
GLOVE BIOGEL M 7.0 STRL (GLOVE) IMPLANT
GLOVE BIOGEL PI IND STRL 7.0 (GLOVE) IMPLANT
GLOVE BIOGEL PI IND STRL 7.5 (GLOVE) ×1 IMPLANT
GLOVE BIOGEL PI IND STRL 8.5 (GLOVE) ×1 IMPLANT
GLOVE BIOGEL PI INDICATOR 7.0 (GLOVE) ×4
GLOVE BIOGEL PI INDICATOR 7.5 (GLOVE) ×4
GLOVE BIOGEL PI INDICATOR 8.5 (GLOVE) ×2
GLOVE ECLIPSE 8.0 STRL XLNG CF (GLOVE) IMPLANT
GLOVE ORTHO TXT STRL SZ7.5 (GLOVE) ×6 IMPLANT
GLOVE SURG ORTHO 8.0 STRL STRW (GLOVE) ×3 IMPLANT
GOWN SPEC L3 XXLG W/TWL (GOWN DISPOSABLE) ×2 IMPLANT
GOWN STRL REUS W/TWL LRG LVL3 (GOWN DISPOSABLE) ×3 IMPLANT
GOWN STRL REUS W/TWL XL LVL3 (GOWN DISPOSABLE) ×6 IMPLANT
K-WIRE 2.0 (WIRE) ×3
K-WIRE FXSTD 280X2XNS SS (WIRE) ×1
KIT BASIN OR (CUSTOM PROCEDURE TRAY) ×3 IMPLANT
KWIRE FXSTD 280X2XNS SS (WIRE) IMPLANT
MANIFOLD NEPTUNE II (INSTRUMENTS) ×3 IMPLANT
PACK TOTAL JOINT (CUSTOM PROCEDURE TRAY) ×3 IMPLANT
PLATE DIST FEM 12H (Plate) ×2 IMPLANT
POSITIONER SURGICAL ARM (MISCELLANEOUS) ×3 IMPLANT
SCREW 5.0 32MM (Screw) ×2 IMPLANT
SCREW NCB 3.5X75X5X6.2XST (Screw) IMPLANT
SCREW NCB 5.0X26 FEMUR (Screw) ×2 IMPLANT
SCREW NCB 5.0X26MM (Screw) ×2 IMPLANT
SCREW NCB 5.0X30MM (Screw) ×4 IMPLANT
SCREW NCB 5.0X34MM (Screw) ×4 IMPLANT
SCREW NCB 5.0X38 (Screw) ×2 IMPLANT
SCREW NCB 5.0X55MM (Screw) ×2 IMPLANT
SCREW NCB 5.0X75MM (Screw) ×3 IMPLANT
STAPLER VISISTAT 35W (STAPLE) ×1 IMPLANT
STRIP CLOSURE SKIN 1/2X4 (GAUZE/BANDAGES/DRESSINGS) ×1 IMPLANT
SUT MNCRL AB 3-0 PS2 18 (SUTURE) ×2 IMPLANT
SUT MNCRL AB 4-0 PS2 18 (SUTURE) ×1 IMPLANT
SUT STRATAFIX 0 PDS 27 VIOLET (SUTURE) ×3
SUT VIC AB 1 CT1 36 (SUTURE) ×4 IMPLANT
SUT VIC AB 2-0 CT1 27 (SUTURE) ×3
SUT VIC AB 2-0 CT1 TAPERPNT 27 (SUTURE) ×2 IMPLANT
SUTURE STRATFX 0 PDS 27 VIOLET (SUTURE) IMPLANT
TOWEL OR 17X26 10 PK STRL BLUE (TOWEL DISPOSABLE) ×4 IMPLANT

## 2018-02-28 NOTE — Anesthesia Preprocedure Evaluation (Addendum)
Anesthesia Evaluation  Patient identified by MRN, date of birth, ID band Patient awake    Reviewed: Allergy & Precautions, H&P , Patient's Chart, lab work & pertinent test results, reviewed documented beta blocker date and time   Airway Mallampati: II  TM Distance: >3 FB Neck ROM: full    Dental no notable dental hx.    Pulmonary    Pulmonary exam normal breath sounds clear to auscultation       Cardiovascular negative cardio ROS   Rhythm:regular Rate:Normal     Neuro/Psych  Headaches,    GI/Hepatic negative GI ROS, Neg liver ROS,   Endo/Other  negative endocrine ROS  Renal/GU negative Renal ROS     Musculoskeletal  (+) Arthritis ,   Abdominal   Peds  Hematology negative hematology ROS (+)   Anesthesia Other Findings   Reproductive/Obstetrics                            Anesthesia Physical Anesthesia Plan  ASA: II  Anesthesia Plan: Spinal   Post-op Pain Management:    Induction: Intravenous  PONV Risk Score and Plan: 2 and Treatment may vary due to age or medical condition, Dexamethasone and Ondansetron  Airway Management Planned: Simple Face Mask and Nasal Cannula  Additional Equipment:   Intra-op Plan:   Post-operative Plan:   Informed Consent: I have reviewed the patients History and Physical, chart, labs and discussed the procedure including the risks, benefits and alternatives for the proposed anesthesia with the patient or authorized representative who has indicated his/her understanding and acceptance.   Dental Advisory Given  Plan Discussed with: CRNA and Surgeon  Anesthesia Plan Comments: (  )       Anesthesia Quick Evaluation

## 2018-02-28 NOTE — Brief Op Note (Signed)
02/28/2018  12:38 PM  PATIENT:  [name]  67 y.o. female  PRE-OPERATIVE DIAGNOSIS:  Left distal femur periprosthetic fracture  POST-OPERATIVE DIAGNOSIS:  Left distal femur periprosthetic fracture  PROCEDURE:  Procedure(s) with comments: OPEN REDUCTION INTERNAL FIXATION (ORIF) LEFT DISTAL FEMUR FRACTURE (Left) - 120 mins  SURGEON:  Surgeon(s) and Role:    Durene Romans* Reshard Guillet, MD - Primary  PHYSICIAN ASSISTANT: None  ANESTHESIA:   spinal  EBL:  <300 cc   BLOOD ADMINISTERED:none  DRAINS: none   LOCAL MEDICATIONS USED:  NONE  SPECIMEN:  No Specimen  DISPOSITION OF SPECIMEN:  N/A  COUNTS:  YES  TOURNIQUET:  * No tourniquets in log *  DICTATION: .Other Dictation: Dictation Number [id number]  PLAN OF CARE: Admit to inpatient   PATIENT DISPOSITION:  PACU - hemodynamically stable.   Delay start of Pharmacological VTE agent (>24hrs) due to surgical blood loss or risk of bleeding: no

## 2018-02-28 NOTE — Op Note (Signed)
[name], [name]              ACCOUNT NO.:  [id number]  MEDICAL RECORD NO.:  [id number]  LOCATION:  [facility]                         FACILITY:  [facility]  PHYSICIAN:  Madlyn Frankel. Charlann Boxer, M.D.  DATE OF BIRTH:  Oct 14, 1951  DATE OF PROCEDURE:  02/28/2018 DATE OF DISCHARGE:                              OPERATIVE REPORT   PREOPERATIVE DIAGNOSIS:  Distal closed left periprosthetic femur fracture.  POSTOPERATIVE DIAGNOSIS:  Distal closed left periprosthetic femur fracture.  PROCEDURE:  Open reduction and internal fixation of distal periprosthetic femur fracture utilizing a Biomet distal femoral plate, 12 holes with 4 proximal bicortical screws and 5 screws distally that were then locked with an end cap.  SURGEON:  Madlyn Frankel. Charlann Boxer, M.D.  ASSISTANT:  Surgical team.  ANESTHESIA:  Spinal.  SPECIMENS:  None.  COMPLICATION:  None.  BLOOD LOSS:  Less than 300 cc.  INDICATIONS FOR PROCEDURE:  [name] is a very pleasant 67 year old female with a recent history of simultaneously performed bilateral total knee arthroplasties about 2 months ago.  After her 6 or 7-week visit, she was seen in the office, she had gone out walking and was knocked over by dog.  At that time, she was noted to have a distal femur fracture confirmed by CT scan.  Indications for the procedure were discussed and reviewed.  Consent was obtained for admission and fracture management.  PROCEDURE IN DETAIL:  The patient was brought to the operative theater. Once adequate anesthesia, preoperative antibiotics, Ancef administered, she was positioned supine.  The left lower extremity was then prepped and draped in sterile fashion.  A time-out was performed identifying the patient, planned procedure and extremity.  An incision was made over the lateral side of her thigh from the joint line proximal.  The iliotibial band was exposed and incised longitudinally for exposure.  The distal femur was exposed including the  femoral component.  We evacuated the remaining hematoma and seroma in this area.  Fracture site was identified.  I used a Cobb elevator to try to open this up, but she had collapse a little bit into valgus.  Once I had it confirmed radiographically that it was a line as I could get it, I placed a 12-hole plate along the lateral side of the femur as distal as it could go.  I then placed screws into the distal femur using the jig, aiming distally to any bone that I could.  I was able to get 5 screws in this area.  After I had placed 2 or 3 of the screws, I made certain that the plate was oriented along the lateral shaft of her femur, oriented with bow of the femur.  Once I confirmed this, I placed a bicortical screw through the plate proximally, locking the plate to the femur.  I replaced the remaining screws in the distal femur and then put the locking caps over top of this to convert these into locking screws to provide as much fixation.  Once this was done, I placed three other bicortical screws into the proximal femur sucking the plate down to bone nicely.  Final radiographs were obtained in AP and lateral planes.  The wounds were irrigated  with normal saline solution.  Screws were closed with 2-0 Vicryl.  The distal wound was closed with #1 Vicryl and 0 Stratafix suture.  The remainder of the wound was closed with 2-0 Vicryl and running 3-0 Monocryl.  The wounds were all cleaned, dried and dressed sterilely using surgical glue and Aquacel dressing.  Her knee was wrapped in Ace wrap.  She was then brought to the recovery room in stable condition tolerating the procedure well.  I will have her be nonweightbearing for probably 6 weeks, but we will allow her to work on range of motion based on the fixation that I was able to get.     Madlyn FrankelMatthew D. Charlann Boxerlin, M.D.     MDO/MEDQ  D:  02/28/2018  T:  02/28/2018  Job:  [id number]

## 2018-02-28 NOTE — Interval H&P Note (Signed)
History and Physical Interval Note:  02/28/2018 12:37 PM  [name]  has presented today for surgery, with the diagnosis of Left distal femur periprosthetic fracture  The various methods of treatment have been discussed with the patient and family. After consideration of risks, benefits and other options for treatment, the patient has consented to  Procedure(s) with comments: OPEN REDUCTION INTERNAL FIXATION (ORIF) LEFT DISTAL FEMUR FRACTURE (Left) - 120 mins as a surgical intervention .  The patient's history has been reviewed, patient examined, no change in status, stable for surgery.  I have reviewed the patient's chart and labs.  Questions were answered to the patient's satisfaction.     Shelda PalMatthew D Hernan Turnage

## 2018-02-28 NOTE — Anesthesia Postprocedure Evaluation (Signed)
Anesthesia Post Note  Patient: [name]  Procedure(s) Performed: OPEN REDUCTION INTERNAL FIXATION (ORIF) LEFT DISTAL FEMUR FRACTURE (Left )     Patient location during evaluation: PACU Anesthesia Type: Spinal Level of consciousness: awake Pain management: pain level controlled Vital Signs Assessment: post-procedure vital signs reviewed and stable Respiratory status: spontaneous breathing Cardiovascular status: stable Anesthetic complications: no    Last Vitals:  Vitals Value Taken Time  BP 102/71 02/28/2018  3:15 PM  Temp    Pulse 79 02/28/2018  3:24 PM  Resp 21 02/28/2018  3:24 PM  SpO2 100 % 02/28/2018  3:24 PM  Vitals shown include unvalidated device data.  Last Pain:  Vitals:   02/28/18 1057  TempSrc:   PainSc: 7                  Kam Rahimi

## 2018-02-28 NOTE — Anesthesia Procedure Notes (Addendum)
Spinal  Patient location during procedure: OR Start time: 02/28/2018 1:12 PM End time: 02/28/2018 1:18 PM Reason for block: at surgeon's request Staffing Resident/CRNA: Anne Fu, CRNA Performed: resident/CRNA  Preanesthetic Checklist Completed: patient identified, site marked, surgical consent, pre-op evaluation, timeout performed, IV checked, risks and benefits discussed and monitors and equipment checked Spinal Block Patient position: sitting Prep: DuraPrep Patient monitoring: heart rate, continuous pulse ox and blood pressure Approach: midline Location: L2-3 Injection technique: single-shot Needle Needle type: Pencan  Needle gauge: 24 G Needle length: 9 cm Assessment Sensory level: T6 Additional Notes Expiration date of kit checked and confirmed. Patient tolerated procedure well, without complications. X 1 attempt with noted clear CSF return. Loss of motor and sensory on exam post injection.

## 2018-02-28 NOTE — Transfer of Care (Signed)
Immediate Anesthesia Transfer of Care Note  Patient: [name]  Procedure(s) Performed: Procedure(s) with comments: OPEN REDUCTION INTERNAL FIXATION (ORIF) LEFT DISTAL FEMUR FRACTURE (Left) - 120 mins  Patient Location: PACU  Anesthesia Type:Spinal  Level of Consciousness:  sedated, patient cooperative and responds to stimulation  Airway & Oxygen Therapy:Patient Spontanous Breathing and Patient connected to face mask oxgen  Post-op Assessment:  Report given to PACU RN and Post -op Vital signs reviewed and stable  Post vital signs:  Reviewed and stable  Last Vitals:  Vitals:   02/28/18 1047  BP: 123/72  Pulse: 85  Resp: 16  Temp: 37.2 C  SpO2: 98%    Complications: No apparent anesthesia complications

## 2018-03-01 ENCOUNTER — Encounter (HOSPITAL_COMMUNITY): Payer: Self-pay | Admitting: Orthopedic Surgery

## 2018-03-01 LAB — CBC
HEMATOCRIT: 35.7 % — AB (ref 36.0–46.0)
HEMOGLOBIN: 11.8 g/dL — AB (ref 12.0–15.0)
MCH: 30 pg (ref 26.0–34.0)
MCHC: 33.1 g/dL (ref 30.0–36.0)
MCV: 90.8 fL (ref 78.0–100.0)
Platelets: 389 10*3/uL (ref 150–400)
RBC: 3.93 MIL/uL (ref 3.87–5.11)
RDW: 12.8 % (ref 11.5–15.5)
WBC: 10.3 10*3/uL (ref 4.0–10.5)

## 2018-03-01 LAB — BASIC METABOLIC PANEL
Anion gap: 12 (ref 5–15)
BUN: 14 mg/dL (ref 6–20)
CHLORIDE: 100 mmol/L — AB (ref 101–111)
CO2: 25 mmol/L (ref 22–32)
CREATININE: 0.58 mg/dL (ref 0.44–1.00)
Calcium: 9.5 mg/dL (ref 8.9–10.3)
GFR calc non Af Amer: >60 mL/min (ref >60)
Glucose, Bld: 149 mg/dL — ABNORMAL HIGH (ref 65–99)
POTASSIUM: 4.2 mmol/L (ref 3.5–5.1)
SODIUM: 137 mmol/L (ref 135–145)

## 2018-03-01 MED ORDER — POLYETHYLENE GLYCOL 3350 17 G PO PACK: 17.0000 g | PACK | Freq: Two times a day (BID) | ORAL | 0 refills | Status: DC

## 2018-03-01 MED ORDER — FERROUS SULFATE 325 (65 FE) MG PO TABS: 325.0000 mg | ORAL_TABLET | Freq: Three times a day (TID) | ORAL | 3 refills | Status: DC

## 2018-03-01 MED ORDER — ASPIRIN 325 MG PO TBEC: 325.0000 mg | DELAYED_RELEASE_TABLET | Freq: Two times a day (BID) | ORAL | 0 refills | Status: DC

## 2018-03-01 MED ORDER — ZOLPIDEM TARTRATE 5 MG PO TABS: 5.0000 mg | ORAL_TABLET | Freq: Every evening | ORAL | 0 refills | Status: DC | PRN

## 2018-03-01 MED ORDER — ACETAMINOPHEN 325 MG PO TABS: 325.0000 mg | ORAL_TABLET | Freq: Four times a day (QID) | ORAL | Status: DC | PRN

## 2018-03-01 MED ORDER — OXYCODONE HCL 10 MG PO TABS: 10.0000 mg | ORAL_TABLET | ORAL | 0 refills | Status: DC | PRN

## 2018-03-01 MED ORDER — METHOCARBAMOL 500 MG PO TABS: 500.0000 mg | ORAL_TABLET | Freq: Four times a day (QID) | ORAL | Status: DC | PRN

## 2018-03-01 NOTE — Progress Notes (Signed)
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  Reviewed HEP handout and pt aware to perform movement gently and maintain NWB status.  Pt plans to f/u with OPPT for continued therapy of her R TKA.  Pt had no further questions and anticipates d/c home today.   Follow Up Recommendations  Follow surgeon's recommendation for DC plan and follow-up therapies     Equipment Recommendations  None recommended by PT    Recommendations for Other Services       Precautions / Restrictions Precautions Precautions: Fall;Knee Restrictions Weight Bearing Restrictions: Yes LLE Weight Bearing: Non weight bearing    Mobility  Bed Mobility Overal bed mobility: Needs Assistance Bed Mobility: Supine to Sit     Supine to sit: Supervision     General bed mobility comments: pt up in recliner  Transfers Overall transfer level: Needs assistance Equipment used: Rolling walker (2 wheeled) Transfers: Sit to/from Stand Sit to Stand: Supervision         General transfer comment: maintains NWB status well  Ambulation/Gait Ambulation/Gait assistance: Min guard Ambulation Distance (Feet): 120 Feet Assistive device: Rolling walker (2 wheeled)       General Gait Details: swing through pattern with RW, maintains NWB well, improved posture   Stairs            Wheelchair Mobility    Modified Rankin (Stroke Patients Only)       Balance                                            Cognition Arousal/Alertness: Awake/alert Behavior During Therapy: WFL for tasks assessed/performed Overall Cognitive Status: Within Functional Limits for tasks assessed                                         Exercises Total Joint Exercises Ankle Circles/Pumps: AROM;Both;10 reps Quad Sets: AROM;Both;10 reps Short Arc Quad: 10 reps;AAROM;Left Heel Slides: 10 reps;AAROM;Left;Seated Hip ABduction/ADduction: 10 reps;AAROM;Left;Supine Straight Leg Raises: 10 reps;AAROM;Left    General Comments        Pertinent Vitals/Pain Pain Assessment: 0-10 Pain Score: 2  Pain Location: L thigh Pain Descriptors / Indicators: Aching;Sore Pain Intervention(s): Limited activity within patient's tolerance;Repositioned;Monitored during session;Ice applied    Home Living Family/patient expects to be discharged to:: Private residence Living Arrangements: Spouse/significant other Available Help at Discharge: Family;Friend(s);Available 24 hours/day Type of Home: House Home Access: (small threshold)   Home Layout: Able to live on main level with bedroom/bathroom Home Equipment: Walker - 2 wheels;Bedside commode      Prior Function Level of Independence: Independent with assistive device(s)      Comments: was using RW and KI prior to surgery   PT Goals (current goals can now be found in the care plan section) Acute Rehab PT Goals PT Goal Formulation: With patient Time For Goal Achievement: 03/03/18 Potential to Achieve Goals: Good Progress towards PT goals: Progressing toward goals    Frequency    Min 6X/week  PT Plan Current plan remains appropriate    Co-evaluation              AM-PAC PT "6 Clicks" Daily Activity  Outcome Measure  Difficulty turning over in bed (including adjusting bedclothes, sheets and blankets)?: A Little Difficulty moving from lying on back to sitting on the side of the bed? : A Little Difficulty sitting down on and standing up from a chair with arms (e.g., wheelchair, bedside commode, etc,.)?: A Little Help needed moving to and from a bed to chair (including a wheelchair)?: A Little Help needed walking in hospital  room?: A Little Help needed climbing 3-5 steps with a railing? : A Little 6 Click Score: 18    End of Session Equipment Utilized During Treatment: Gait belt;Left knee immobilizer Activity Tolerance: Patient tolerated treatment well Patient left: in chair;with call bell/phone within reach;with family/visitor present Nurse Communication: Mobility status PT Visit Diagnosis: Other abnormalities of gait and mobility (R26.89)     Time: 1610-9604 PT Time Calculation (min) (ACUTE ONLY): 14 min  Charges:   $Therapeutic Exercise: 8-22 mins                    G Codes:     Zenovia Jarred, PT, DPT 03/01/2018 Pager: [phone]    Maida Sale E 03/01/2018, 12:45 PM

## 2018-03-01 NOTE — Evaluation (Signed)
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  Patient will benefit from skilled PT to increase their independence and safety with mobility to allow discharge to the venue listed below.  Pt assisted with ambulating in hallway and performing a couple exercises.  Pt preferred to use KI with first mobility after surgery.  Pt maintaining NWB status well.  Plan to return for one more visit prior to d/c home today.     Follow Up Recommendations Follow surgeon's recommendation for DC plan and follow-up therapies    Equipment Recommendations  None recommended by PT    Recommendations for Other Services       Precautions / Restrictions Precautions Precautions: Fall;Knee Restrictions Weight Bearing Restrictions: Yes LLE Weight Bearing: Non weight bearing      Mobility  Bed Mobility Overal bed mobility: Needs Assistance Bed Mobility: Supine to Sit     Supine to sit: Supervision     General bed mobility comments: pt able to don KI independently, assists L LE with UEs  Transfers Overall transfer level: Needs assistance Equipment used: Rolling walker (2 wheeled) Transfers: Sit to/from Stand Sit to Stand: Min guard         General transfer comment: min/guard for safety, maintains NWB status well  Ambulation/Gait Ambulation/Gait assistance: Min guard Ambulation Distance (Feet): 100 Feet Assistive device: Rolling walker (2 wheeled)       General Gait Details: swing through pattern with RW, maintains NWB well, pt reports pectoral muscles sore from use of RW prior to surgery, discussed and demonstrated correct use  of RW including posture  Stairs            Wheelchair Mobility    Modified Rankin (Stroke Patients Only)       Balance                                             Pertinent Vitals/Pain Pain Assessment: 0-10 Pain Score: 2  Pain Location: L thigh Pain Descriptors / Indicators: Aching;Sore Pain Intervention(s): Limited activity within patient's tolerance;Repositioned;Monitored during session;Ice applied    Home Living Family/patient expects to be discharged to:: Private residence Living Arrangements: Spouse/significant other Available Help at Discharge: Family;Friend(s);Available 24 hours/day Type of Home: House Home Access: (small threshold)     Home Layout: Able to live on main level with bedroom/bathroom Home Equipment: Walker - 2 wheels;Bedside commode      Prior Function Level of Independence: Independent with assistive device(s)         Comments: was using RW and KI prior to surgery     Hand Dominance        Extremity/Trunk Assessment   Upper Extremity Assessment Upper Extremity Assessment: Overall WFL for tasks assessed    Lower Extremity Assessment Lower Extremity Assessment: LLE deficits/detail LLE Deficits / Details: no able to perform SLR, uses UEs to assist with L LE movement, knee flexion AAROM approximately 80*       Communication   Communication: No difficulties  Cognition Arousal/Alertness: Awake/alert Behavior During Therapy: [facility] for  tasks assessed/performed Overall Cognitive Status: Within Functional Limits for tasks assessed                                        General Comments      Exercises Total Joint Exercises Ankle Circles/Pumps: AROM;Both;10 reps Quad Sets: AROM;Both;10 reps Heel Slides: 10 reps;Supine;AAROM;Left Straight Leg Raises: 10 reps;AAROM;Left   Assessment/Plan    PT Assessment Patient needs continued PT services  PT Problem List Decreased mobility;Decreased  strength;Decreased range of motion;Pain;Decreased knowledge of precautions       PT Treatment Interventions Stair training;Gait training;DME instruction;Therapeutic activities;Therapeutic exercise;Patient/family education;Functional mobility training    PT Goals (Current goals can be found in the Care Plan section)  Acute Rehab PT Goals PT Goal Formulation: With patient Time For Goal Achievement: 03/03/18 Potential to Achieve Goals: Good    Frequency Min 6X/week   Barriers to discharge        Co-evaluation               AM-PAC PT "6 Clicks" Daily Activity  Outcome Measure Difficulty turning over in bed (including adjusting bedclothes, sheets and blankets)?: A Little Difficulty moving from lying on back to sitting on the side of the bed? : A Little Difficulty sitting down on and standing up from a chair with arms (e.g., wheelchair, bedside commode, etc,.)?: A Little Help needed moving to and from a bed to chair (including a wheelchair)?: A Little Help needed walking in hospital room?: A Little Help needed climbing 3-5 steps with a railing? : A Little 6 Click Score: 18    End of Session Equipment Utilized During Treatment: Gait belt;Left knee immobilizer Activity Tolerance: Patient tolerated treatment well Patient left: in chair;with call bell/phone within reach;with family/visitor present Nurse Communication: Mobility status PT Visit Diagnosis: Other abnormalities of gait and mobility (R26.89)    Time: [id number] PT Time Calculation (min) (ACUTE ONLY): 31 min   Charges:   PT Evaluation $PT Eval Low Complexity: 1 Low PT Treatments $Gait Training: 8-22 mins   PT G Codes:       Zenovia JarredKati Keonia Pasko, PT, DPT 03/01/2018 Pager: [phone]   Maida SaleLEMYRE,KATHrine E 03/01/2018, 12:01 PM

## 2018-03-01 NOTE — Progress Notes (Signed)
Spoke with patient at bedside. Confirmed plan for OP PT, already arranged. Has RW and 3n1. [phone] 

## 2018-03-01 NOTE — Progress Notes (Signed)
Patient ID: [name], female   DOB: Oct 19, 1951, [age] y.o.   MRN: [id number] Subjective: 1 Day Post-Op Procedure(s) (LRB): OPEN REDUCTION INTERNAL FIXATION (ORIF) LEFT DISTAL FEMUR FRACTURE (Left)    Patient reports pain as mild to moderate.  Tough night last night but ok now.  Husband and friend in room.  Reviewed procedure and findings  Objective:   VITALS:   Vitals:   03/01/18 0157 03/01/18 0637  BP: 113/64 95/66  Pulse: 85 88  Resp: 17 16  Temp: 97.7 F (36.5 C) 98.1 F (36.7 C)  SpO2: 97% 99%    Neurovascular intact Incision: dressing C/D/I  Some left LE swelling post op  LABS Recent Labs    02/28/18 1115 03/01/18 0535  HGB 12.4 11.8*  HCT 37.7 35.7*  WBC 6.7 10.3  PLT 365 389    Recent Labs    03/01/18 0535  NA 137  K 4.2  BUN 14  CREATININE 0.58  GLUCOSE 149*    No results for input(s): LABPT, INR in the last 72 hours.   Assessment/Plan: 1 Day Post-Op Procedure(s) (LRB): OPEN REDUCTION INTERNAL FIXATION (ORIF) LEFT DISTAL FEMUR FRACTURE (Left)   Advance diet Up with therapy  Home today NWB LLE RTC in 2 weeks Reviewed goals and therapy plans with both knees Rx [facility]

## 2018-03-01 NOTE — Progress Notes (Signed)
Plan of care and follow-up reviewed at time of discharge 13:00 with verbalized and demonstrated teach-back. Wheelchair used for transport to her transportation. Donalee Citrinavid Fronnie Urton RN

## 2018-03-05 NOTE — Discharge Summary (Signed)
Physician Discharge Summary  Patient ID: [name] MRN: [id number] DOB/AGE: [birth year] 66 y.o.  Admit date: 02/28/2018 Discharge date: 03/01/2018   Procedures:  Procedure(s) (LRB): OPEN REDUCTION INTERNAL FIXATION (ORIF) LEFT DISTAL FEMUR FRACTURE (Left)  Attending Physician:  Dr. Durene Romans   Admission Diagnoses:   Left distal femur periprosthetic fracture  Discharge Diagnoses:  Active Problems:   Closed fracture of distal end of left femur [facility])  Past Medical History:  Diagnosis Date  . Arthritis   . Headache    hx of    HPI:    Pt is a 67 y.o. female complaining of left distal femur pain for a couple of weeks. Pain had continually increased since the beginning. X-rays in the clinic show some cortical changes in the left distal femur. Pt has tried various conservative treatments which have failed to alleviate their symptoms, including limited WB and activity modification. Various options are discussed with the patient.  Patient is s/p bilateral total knee arthroplasties multiple weeks ago. Risks, benefits and expectations were discussed with the patient. Patient understand the risks, benefits and expectations and wishes to proceed with surgery.   PCP: Daisy Floro, MD   Discharged Condition: good  Hospital Course:  Patient underwent the above stated procedure on 02/28/2018. Patient tolerated the procedure well and brought to the recovery room in good condition and subsequently to the floor.  POD #1 BP: 95/66 ; Pulse: 88 ; Temp: 98.1 F (36.7 C) ; Resp: 16 Patient reports pain as mild to moderate.  Tough night last night but ok now.  Husband and friend in room.  Reviewed procedure and findings.  Ready to be discharged home. Neurovascular intact and incision: dressing C/D/I. Some left LE swelling post op.  LABS  Basename    HGB     11.8  HCT     35.7    Discharge Exam: General appearance: alert, cooperative and no distress Extremities: Homans sign is  negative, no sign of DVT, no edema, redness or tenderness in the calves or thighs and no ulcers, gangrene or trophic changes  Disposition:  Home with follow up in 2 weeks   Follow-up Information    Durene Romans, MD.   Specialty:  Orthopedic Surgery Contact information: [address] [place] [address] [phone]           Discharge Instructions    Call MD / Call 911   Complete by:  As directed    If you experience chest pain or shortness of breath, CALL 911 and be transported to the hospital emergency room.  If you develope a fever above 101 F, pus (white drainage) or increased drainage or redness at the wound, or calf pain, call your surgeon's office.   Change dressing   Complete by:  As directed    Maintain surgical dressing until follow up in the clinic. If the edges start to pull up, may reinforce with tape. If the dressing is no longer working, may remove and cover with gauze and tape, but must keep the area dry and clean.  Call with any questions or concerns.   Constipation Prevention   Complete by:  As directed    Drink plenty of fluids.  Prune juice may be helpful.  You may use a stool softener, such as Colace (over the counter) 100 mg twice a day.  Use MiraLax (over the counter) for constipation as needed.   Diet - low sodium heart healthy   Complete by:  As directed    Discharge instructions   Complete by:  As directed    Maintain surgical dressing until follow up in the clinic. If the edges start to pull up, may reinforce with tape. If the dressing is no longer working, may remove and cover with gauze and tape, but must keep the area dry and clean.  Follow up in 2 weeks at [facility]. Call with any questions or concerns.   Increase activity slowly as tolerated   Complete by:  As directed    Other:  Non-weight bearing left lower leg for at least 6 weeks, then slowly increase WB if healing well.   TED hose   Complete by:  As directed      Use stockings (TED hose) for 2 weeks on both leg(s).  You may remove them at night for sleeping.      Allergies as of 03/01/2018   No Known Allergies     Medication List    STOP taking these medications   aspirin 81 MG chewable tablet Replaced by:  aspirin 325 MG EC tablet   docusate sodium 100 MG capsule Commonly known as:  COLACE   ibuprofen 800 MG tablet Commonly known as:  ADVIL,MOTRIN   meloxicam 7.5 MG tablet Commonly known as:  MOBIC   naproxen sodium 220 MG tablet Commonly known as:  ALEVE   rivaroxaban 10 MG Tabs tablet Commonly known as:  XARELTO     TAKE these medications   acetaminophen 325 MG tablet Commonly known as:  TYLENOL Take 1-2 tablets (325-650 mg total) by mouth every 6 (six) hours as needed for mild pain (pain score 1-3 or temp > 100.5). What changed:    medication strength  how much to take  when to take this  reasons to take this   aspirin 325 MG EC tablet Take 1 tablet (325 mg total) by mouth 2 (two) times daily. Replaces:  aspirin 81 MG chewable tablet   Biotin 10 MG Tabs Take 10 mg by mouth daily.   bisacodyl 5 MG EC tablet Commonly known as:  DULCOLAX Take 5 mg by mouth daily as needed for mild constipation or moderate constipation.   CALCIUM 1200 PO Take 1,200 mg by mouth daily.   CINNAMON PO Take 1,000 mg by mouth daily.   ferrous sulfate 325 (65 FE) MG tablet Take 1 tablet (325 mg total) by mouth 3 (three) times daily after meals. What changed:  when to take this   GLUCOSAMINE PO Take 1,000 mg by mouth daily.   Magnesium 250 MG Tabs Take 250 mg by mouth daily.   methocarbamol 500 MG tablet Commonly known as:  ROBAXIN Take 1 tablet (500 mg total) by mouth every 6 (six) hours as needed for muscle spasms. What changed:  when to take this   MULTIVITAMIN PO Take 1 tablet by mouth daily.   Oxycodone HCl 10 MG Tabs Take 1-1.5 tablets (10-15 mg total) by mouth every 4 (four) hours as needed for severe pain (pain  score 7-10). What changed:    medication strength  how much to take  reasons to take this   polyethylene glycol packet Commonly known as:  MIRALAX / GLYCOLAX Take 17 g by mouth 2 (two) times daily. What changed:    when to take this  reasons to take this   pyridOXINE 100 MG tablet Commonly known as:  VITAMIN B-6 Take 100 mg by mouth daily.   Turmeric 500 MG Tabs Take 500 mg by  mouth daily.   Vitamin D3 1000 units Caps Take 1,000 Units by mouth daily.   zolpidem 5 MG tablet Commonly known as:  AMBIEN Take 1 tablet (5 mg total) by mouth at bedtime as needed for sleep.            Discharge Care Instructions  (From admission, onward)        Start     Ordered   03/01/18 0000  Change dressing    Comments:  Maintain surgical dressing until follow up in the clinic. If the edges start to pull up, may reinforce with tape. If the dressing is no longer working, may remove and cover with gauze and tape, but must keep the area dry and clean.  Call with any questions or concerns.   03/01/18 0759       Signed: Anastasio AuerbachMatthew S. Akaylah Lalley   PA-C  03/05/2018, 2:15 PM

## 2018-11-10 ENCOUNTER — Encounter (HOSPITAL_COMMUNITY): Payer: Self-pay | Admitting: Emergency Medicine

## 2018-11-10 ENCOUNTER — Inpatient Hospital Stay (HOSPITAL_COMMUNITY)
Admission: EM | Admit: 2018-11-10 | Discharge: 2018-11-13 | DRG: 580 | Disposition: A | Discharge status: Home / Self Care | Payer: No Typology Code available for payment source | Source: Ambulatory Visit | Attending: Internal Medicine | Admitting: Internal Medicine

## 2018-11-10 ENCOUNTER — Emergency Department (HOSPITAL_COMMUNITY): Payer: No Typology Code available for payment source

## 2018-11-10 ENCOUNTER — Other Ambulatory Visit: Payer: Self-pay

## 2018-11-10 ENCOUNTER — Emergency Department (HOSPITAL_COMMUNITY)
Admission: EM | Admit: 2018-11-10 | Discharge: 2018-11-10 | Disposition: A | Discharge status: Home / Self Care | Payer: No Typology Code available for payment source | Attending: Emergency Medicine | Admitting: Emergency Medicine

## 2018-11-10 DIAGNOSIS — B9562 Methicillin resistant Staphylococcus aureus infection as the cause of diseases classified elsewhere: Secondary | ICD-10-CM | POA: Diagnosis present

## 2018-11-10 DIAGNOSIS — R52 Pain, unspecified: Secondary | ICD-10-CM

## 2018-11-10 DIAGNOSIS — L02519 Cutaneous abscess of unspecified hand: Secondary | ICD-10-CM | POA: Diagnosis present

## 2018-11-10 DIAGNOSIS — N179 Acute kidney failure, unspecified: Secondary | ICD-10-CM | POA: Diagnosis not present

## 2018-11-10 DIAGNOSIS — L03114 Cellulitis of left upper limb: Secondary | ICD-10-CM | POA: Diagnosis not present

## 2018-11-10 DIAGNOSIS — E611 Iron deficiency: Secondary | ICD-10-CM | POA: Diagnosis present

## 2018-11-10 DIAGNOSIS — Z96653 Presence of artificial knee joint, bilateral: Secondary | ICD-10-CM | POA: Diagnosis present

## 2018-11-10 DIAGNOSIS — L02512 Cutaneous abscess of left hand: Secondary | ICD-10-CM | POA: Insufficient documentation

## 2018-11-10 DIAGNOSIS — Z7982 Long term (current) use of aspirin: Secondary | ICD-10-CM

## 2018-11-10 DIAGNOSIS — Z79899 Other long term (current) drug therapy: Secondary | ICD-10-CM | POA: Insufficient documentation

## 2018-11-10 DIAGNOSIS — M79645 Pain in left finger(s): Secondary | ICD-10-CM | POA: Diagnosis present

## 2018-11-10 DIAGNOSIS — R739 Hyperglycemia, unspecified: Secondary | ICD-10-CM | POA: Diagnosis present

## 2018-11-10 DIAGNOSIS — L03119 Cellulitis of unspecified part of limb: Secondary | ICD-10-CM

## 2018-11-10 DIAGNOSIS — L03012 Cellulitis of left finger: Secondary | ICD-10-CM | POA: Diagnosis present

## 2018-11-10 DIAGNOSIS — D509 Iron deficiency anemia, unspecified: Secondary | ICD-10-CM | POA: Diagnosis present

## 2018-11-10 LAB — BASIC METABOLIC PANEL
Anion gap: 11 (ref 5–15)
BUN: 29 mg/dL — AB (ref 8–23)
CALCIUM: 9.2 mg/dL (ref 8.9–10.3)
CO2: 22 mmol/L (ref 22–32)
CREATININE: 1.14 mg/dL — AB (ref 0.44–1.00)
Chloride: 104 mmol/L (ref 98–111)
GFR calc non Af Amer: 50 mL/min — ABNORMAL LOW (ref >60)
GFR, EST AFRICAN AMERICAN: 58 mL/min — AB (ref >60)
GLUCOSE: 146 mg/dL — AB (ref 70–99)
Potassium: 4 mmol/L (ref 3.5–5.1)
Sodium: 137 mmol/L (ref 135–145)

## 2018-11-10 LAB — CBC WITH DIFFERENTIAL/PLATELET
Abs Immature Granulocytes: 0.02 10*3/uL (ref 0.00–0.07)
BASOS ABS: 0 10*3/uL (ref 0.0–0.1)
Basophils Relative: 1 %
EOS ABS: 0.5 10*3/uL (ref 0.0–0.5)
EOS PCT: 6 %
HEMATOCRIT: 38.6 % (ref 36.0–46.0)
Hemoglobin: 12.3 g/dL (ref 12.0–15.0)
Immature Granulocytes: 0 %
LYMPHS ABS: 0.9 10*3/uL (ref 0.7–4.0)
Lymphocytes Relative: 11 %
MCH: 29.6 pg (ref 26.0–34.0)
MCHC: 31.9 g/dL (ref 30.0–36.0)
MCV: 93 fL (ref 80.0–100.0)
Monocytes Absolute: 0.7 10*3/uL (ref 0.1–1.0)
Monocytes Relative: 8 %
NRBC: 0 % (ref 0.0–0.2)
Neutro Abs: 6.4 10*3/uL (ref 1.7–7.7)
Neutrophils Relative %: 74 %
Platelets: 317 10*3/uL (ref 150–400)
RBC: 4.15 MIL/uL (ref 3.87–5.11)
RDW: 12.8 % (ref 11.5–15.5)
WBC: 8.6 10*3/uL (ref 4.0–10.5)

## 2018-11-10 LAB — SEDIMENTATION RATE: Sed Rate: 25 mm/hr — ABNORMAL HIGH (ref 0–22)

## 2018-11-10 LAB — C-REACTIVE PROTEIN: CRP: 3.9 mg/dL — ABNORMAL HIGH (ref <1.0)

## 2018-11-10 MED ORDER — CHLORHEXIDINE GLUCONATE 4 % EX LIQD
60.0000 mL | Freq: Once | CUTANEOUS | Status: DC
  Filled 2018-11-10: qty 60

## 2018-11-10 MED ORDER — VANCOMYCIN HCL IN DEXTROSE 1-5 GM/200ML-% IV SOLN
1000.0000 mg | INTRAVENOUS | Status: DC
  Administered 2018-11-11 – 2018-11-12 (×2): 1000 mg via INTRAVENOUS
  Filled 2018-11-10 (×3): qty 200

## 2018-11-10 MED ORDER — VITAMIN B-6 100 MG PO TABS
100.0000 mg | ORAL_TABLET | Freq: Every day | ORAL | Status: DC
  Administered 2018-11-12 – 2018-11-13 (×2): 100 mg via ORAL
  Filled 2018-11-10 (×4): qty 1

## 2018-11-10 MED ORDER — VITAMIN D 25 MCG (1000 UNIT) PO TABS
1000.0000 [IU] | ORAL_TABLET | Freq: Every day | ORAL | Status: DC
  Administered 2018-11-12 – 2018-11-13 (×2): 1000 [IU] via ORAL
  Filled 2018-11-10 (×2): qty 1

## 2018-11-10 MED ORDER — MORPHINE SULFATE (PF) 2 MG/ML IV SOLN
2.0000 mg | INTRAVENOUS | Status: AC | PRN
  Administered 2018-11-11: 2 mg via INTRAVENOUS
  Filled 2018-11-10: qty 1

## 2018-11-10 MED ORDER — DM-GUAIFENESIN ER 30-600 MG PO TB12: 1.0000 | ORAL_TABLET | Freq: Two times a day (BID) | ORAL | Status: DC | PRN

## 2018-11-10 MED ORDER — BISACODYL 5 MG PO TBEC
5.0000 mg | DELAYED_RELEASE_TABLET | Freq: Every day | ORAL | Status: DC | PRN
  Administered 2018-11-12 – 2018-11-13 (×2): 5 mg via ORAL
  Filled 2018-11-10 (×2): qty 1

## 2018-11-10 MED ORDER — SODIUM CHLORIDE 0.9 % IV SOLN
INTRAVENOUS | Status: DC | PRN
  Administered 2018-11-10 – 2018-11-11 (×2): via INTRAVENOUS

## 2018-11-10 MED ORDER — SODIUM CHLORIDE 0.9 % IV SOLN
INTRAVENOUS | Status: DC | PRN
  Administered 2018-11-10: 20:00:00 via INTRAVENOUS

## 2018-11-10 MED ORDER — ACETAMINOPHEN 325 MG PO TABS: 650.0000 mg | ORAL_TABLET | Freq: Four times a day (QID) | ORAL | Status: DC | PRN

## 2018-11-10 MED ORDER — BIOTIN 1000 MCG PO TABS: ORAL_TABLET | Freq: Every day | ORAL | Status: DC

## 2018-11-10 MED ORDER — ASPIRIN EC 325 MG PO TBEC
325.0000 mg | DELAYED_RELEASE_TABLET | Freq: Two times a day (BID) | ORAL | Status: DC
  Administered 2018-11-11 – 2018-11-13 (×4): 325 mg via ORAL
  Filled 2018-11-10 (×4): qty 1

## 2018-11-10 MED ORDER — GLUCOSAMINE 500 MG PO CAPS: 1000.0000 mg | ORAL_CAPSULE | Freq: Every day | ORAL | Status: DC

## 2018-11-10 MED ORDER — ONDANSETRON HCL 4 MG/2ML IJ SOLN: 4.0000 mg | Freq: Three times a day (TID) | INTRAMUSCULAR | Status: DC | PRN

## 2018-11-10 MED ORDER — ADULT MULTIVITAMIN LIQUID CH
Freq: Every day | ORAL | Status: DC
  Filled 2018-11-10 (×3): qty 15

## 2018-11-10 MED ORDER — LIDOCAINE HCL 2 % IJ SOLN
5.0000 mL | Freq: Once | INTRAMUSCULAR | Status: AC
  Administered 2018-11-10: 100 mg
  Filled 2018-11-10: qty 20

## 2018-11-10 MED ORDER — KETOROLAC TROMETHAMINE 30 MG/ML IJ SOLN
15.0000 mg | Freq: Once | INTRAMUSCULAR | Status: AC
  Administered 2018-11-10: 15 mg via INTRAVENOUS
  Filled 2018-11-10: qty 1

## 2018-11-10 MED ORDER — MORPHINE SULFATE (PF) 4 MG/ML IV SOLN
4.0000 mg | Freq: Once | INTRAVENOUS | Status: AC | PRN
  Administered 2018-11-10: 4 mg via INTRAVENOUS
  Filled 2018-11-10: qty 1

## 2018-11-10 MED ORDER — CINNAMON 500 MG PO CAPS: 1000.0000 mg | ORAL_CAPSULE | Freq: Every day | ORAL | Status: DC

## 2018-11-10 MED ORDER — ZOLPIDEM TARTRATE 5 MG PO TABS: 5.0000 mg | ORAL_TABLET | Freq: Every evening | ORAL | Status: DC | PRN

## 2018-11-10 MED ORDER — MAGNESIUM OXIDE 400 (241.3 MG) MG PO TABS
200.0000 mg | ORAL_TABLET | Freq: Every day | ORAL | Status: DC
  Administered 2018-11-12 – 2018-11-13 (×2): 200 mg via ORAL
  Filled 2018-11-10 (×2): qty 1

## 2018-11-10 MED ORDER — CALCIUM CARBONATE 1250 (500 CA) MG PO TABS: 1.0000 | ORAL_TABLET | Freq: Every day | ORAL | Status: DC

## 2018-11-10 MED ORDER — CEFAZOLIN SODIUM-DEXTROSE 2-4 GM/100ML-% IV SOLN
2.0000 g | INTRAVENOUS | Status: DC
  Filled 2018-11-10: qty 100

## 2018-11-10 MED ORDER — OXYCODONE-ACETAMINOPHEN 5-325 MG PO TABS
1.0000 | ORAL_TABLET | ORAL | Status: DC | PRN
  Administered 2018-11-11 – 2018-11-13 (×8): 1 via ORAL
  Filled 2018-11-10 (×10): qty 1

## 2018-11-10 MED ORDER — TURMERIC 500 MG PO TABS: 500.0000 mg | ORAL_TABLET | Freq: Every day | ORAL | Status: DC

## 2018-11-10 MED ORDER — ONDANSETRON HCL 4 MG/2ML IJ SOLN
4.0000 mg | Freq: Once | INTRAMUSCULAR | Status: AC
  Administered 2018-11-10: 4 mg via INTRAVENOUS
  Filled 2018-11-10: qty 2

## 2018-11-10 MED ORDER — GABAPENTIN 400 MG PO CAPS
1200.0000 mg | ORAL_CAPSULE | Freq: Every evening | ORAL | Status: DC | PRN
  Administered 2018-11-10 – 2018-11-13 (×3): 1200 mg via ORAL
  Filled 2018-11-10 (×3): qty 3

## 2018-11-10 MED ORDER — VANCOMYCIN HCL IN DEXTROSE 1-5 GM/200ML-% IV SOLN
1000.0000 mg | Freq: Once | INTRAVENOUS | Status: AC
  Administered 2018-11-10: 1000 mg via INTRAVENOUS
  Filled 2018-11-10: qty 200

## 2018-11-10 MED ORDER — ACETAMINOPHEN 650 MG RE SUPP: 650.0000 mg | Freq: Four times a day (QID) | RECTAL | Status: DC | PRN

## 2018-11-10 NOTE — H&P (Signed)
History and Physical    [name] ZCH:[id number] DOB: 1951/01/16 DOA: 11/10/2018  Referring MD/NP/PA:   PCP: Rodney Langton, MD   Patient coming from:  The patient is coming from home.  At baseline, pt is independent for most of ADL.        Chief Complaint: left hand pain  HPI: [name] is a 67 y.o. female with medical history significant of iron deficiency anemia, arthritis, who presents with left hand pain.  Pt states that she had a insect bite to her left middle finger on 11/26, then she gradually developed swelling, pain and erythema.  The problem has been progressively getting worse. She was seen in [place] on Friday and was started on Bactrim.  She states that she is taking antibiotics, but pain is getting worse.  Yesterday morning, she was seen in the ED again, and had I&D. She reports a large amount of purulent discharge was drained. She continues to take Bactrim, but the pain has worsened today  Swelling and erythema have been spreading up to hand and wrist area dorsally.  The pain is constant, 9 out of 10 severity, sharp, nonradiating, aggravated by finger movement. Patient denies fever or chills.  She has dry cough, but no chest pain or shortness breath.  Denies nausea, vomiting, diarrhea, abdominal pain, symptoms of UTI or unilateral weakness. 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  ED Course: pt was found to have WBC 8.6, mild AKI with creatinine 1.14, BUN 29, temperature normal, no tachycardia, no tachypnea, oxygen saturation 97% on room air. X-ray negative for bony fracture, soft tissue swelling adjacent the third proximal phalanx and significant degenerative change of the first carpometacarpal joint. Pt is placed on MedSurg [facility]ana for observation.  Orthopedic surgeon, Dr. Griffin Basil was consulted.  Review of Systems:   General: no fevers, chills, no body weight gain, has fatigue HEENT: no blurry vision, hearing changes or sore throat Respiratory: no  dyspnea, coughing, wheezing CV: no chest pain, no palpitations GI: no nausea, vomiting, abdominal pain, diarrhea, constipation GU: no dysuria, burning on urination, increased urinary frequency, hematuria  Ext: no leg edema Neuro: no unilateral weakness, numbness, or tingling, no vision change or hearing loss Skin: Has left hand swelling, erythema, tender, has pus draining in left middle finger MSK: No muscle spasm, no deformity, no limitation of range of movement in spin Heme: No easy bruising.  Travel history: No recent long distant travel.  Allergy: No Known Allergies  Past Medical History:  Diagnosis Date  . Arthritis   . Headache    hx of    Past Surgical History:  Procedure Laterality Date  . APPENDECTOMY    . CARPAL TUNNEL RELEASE    . EYE SURGERY     lasik eye   . HERNIA REPAIR    . JOINT REPLACEMENT     bilateral tka   12-31-17 Dr. Alvan Dame  . OLECRANON BURSECTOMY    . ORIF FEMUR FRACTURE Left 02/28/2018   Procedure: OPEN REDUCTION INTERNAL FIXATION (ORIF) LEFT DISTAL FEMUR FRACTURE;  Surgeon: Paralee Cancel, MD;  Location: [facility];  Service: Orthopedics;  Laterality: Left;  120 mins  . TONSILLECTOMY    . TOTAL KNEE ARTHROPLASTY Bilateral 12/31/2017   Procedure: TOTAL KNEE BILATERAL;  Surgeon: Paralee Cancel, MD;  Location: [facility];  Service: Orthopedics;  Laterality: Bilateral;  . TUBAL LIGATION      Social History:  reports that she has never smoked. She has never used  smokeless tobacco. She reports that she drinks alcohol. She reports that she does not use drugs.  Family History:  Family History  Problem Relation Age of Onset  . Diabetes Mellitus II Mother   . Congestive Heart Failure Mother   . Diabetes Mellitus II Father   . Leukemia Father   . CAD Father      Prior to Admission medications   Medication Sig Start Date End Date Taking? Authorizing Provider  acetaminophen (TYLENOL) 325 MG tablet Take 1-2 tablets (325-650 mg total) by mouth every 6 (six) hours as  needed for mild pain (pain score 1-3 or temp > 100.5). 03/01/18  Yes Danae Orleans, PA-C  aspirin EC 325 MG EC tablet Take 1 tablet (325 mg total) by mouth 2 (two) times daily. 03/01/18  Yes Babish, Rodman Key, PA-C  BIOTIN PO Take 10,000 mg by mouth daily.    Yes [provider]  bisacodyl (DULCOLAX) 5 MG EC tablet Take 5 mg by mouth daily as needed for mild constipation or moderate constipation.    Yes [provider]  Calcium Carbonate-Vit D-Min (CALCIUM 1200 PO) Take 1,200 mg by mouth daily.   Yes [provider]  Cholecalciferol (VITAMIN D3) 1000 units CAPS Take 1,000 Units by mouth daily.   Yes [provider]  CINNAMON PO Take 1,000 mg by mouth daily.   Yes [provider]  gabapentin (NEURONTIN) 300 MG capsule Take 1,200 mg by mouth at bedtime as needed (pain).  09/18/18  Yes [provider]  Glucosamine HCl (GLUCOSAMINE PO) Take 1,000 mg by mouth daily.    Yes [provider]  Magnesium 250 MG TABS Take 250 mg by mouth daily.   Yes [provider]  Multiple Vitamins-Minerals (MULTIVITAMIN PO) Take 1 tablet by mouth daily.   Yes [provider]  pyridOXINE (VITAMIN B-6) 100 MG tablet Take 100 mg by mouth daily.   Yes [provider]  Turmeric 500 MG TABS Take 500 mg by mouth daily.   Yes [provider]  ferrous sulfate 325 (65 FE) MG tablet Take 1 tablet (325 mg total) by mouth 3 (three) times daily after meals. Patient not taking: Reported on 11/10/2018 03/01/18   Danae Orleans, PA-C  methocarbamol (ROBAXIN) 500 MG tablet Take 1 tablet (500 mg total) by mouth every 6 (six) hours as needed for muscle spasms. Patient not taking: Reported on 11/10/2018 03/01/18   Danae Orleans, PA-C  oxyCODONE 10 MG TABS Take 1-1.5 tablets (10-15 mg total) by mouth every 4 (four) hours as needed for severe pain (pain score 7-10). Patient not taking: Reported on 11/10/2018 03/01/18   Danae Orleans, PA-C    polyethylene glycol (MIRALAX / Floria Raveling) packet Take 17 g by mouth 2 (two) times daily. Patient not taking: Reported on 11/10/2018 03/01/18   Danae Orleans, PA-C  zolpidem (AMBIEN) 5 MG tablet Take 1 tablet (5 mg total) by mouth at bedtime as needed for sleep. Patient not taking: Reported on 11/10/2018 03/01/18   Danae Orleans, PA-C    Physical Exam: Vitals:   11/10/18 1829 11/10/18 2229 11/11/18 0003  BP: 121/78  (!) 102/56  Pulse: 78 70 67  Resp: 16 18 16   Temp: 98.4 F (36.9 C) 97.8 F (36.6 C) 97.7 F (36.5 C)  TempSrc: Oral Oral Oral  SpO2: 97% 100% 100%  Weight:  59 kg   Height:  5' 5"  (1.651 m)    General: Not in acute distress HEENT:       Eyes: PERRL,  EOMI, no scleral icterus.       ENT: No discharge from the ears and nose, no pharynx injection, no tonsillar enlargement.        Neck: No JVD, no bruit, no mass felt. Heme: No neck lymph node enlargement. Cardiac: S1/S2, RRR, No murmurs, No gallops or rubs. Respiratory: No rales, wheezing, rhonchi or rubs. GI: Soft, nondistended, nontender, no rebound pain, no organomegaly, BS present. GU: No hematuria Ext: No pitting leg edema bilaterally. 2+DP/PT pulse bilaterally. Musculoskeletal: No joint deformities, No joint redness or warmth, no limitation of ROM in spin. Skin: has swelling, warmth, erythema and tender in left hand up to wrist area.  Left middle finger has small wound distal to the MP joint, with little pus draining. Neuro: Alert, oriented X3, cranial nerves II-XII grossly intact, moves all extremities normally.  Psych: Patient is not psychotic, no suicidal or hemocidal ideation.  Labs on Admission: I have personally reviewed following labs and imaging studies  CBC: Recent Labs  Lab 11/10/18 1919  WBC 8.6  NEUTROABS 6.4  HGB 12.3  HCT 38.6  MCV 93.0  PLT 161   Basic Metabolic Panel: Recent Labs  Lab 11/10/18 1919  NA 137  K 4.0  CL 104  CO2 22  GLUCOSE 146*  BUN 29*  CREATININE 1.14*   CALCIUM 9.2   GFR: Estimated Creatinine Clearance: 43.7 mL/min (A) (by C-G formula based on SCr of 1.14 mg/dL (H)). Liver Function Tests: No results for input(s): AST, ALT, ALKPHOS, BILITOT, PROT, ALBUMIN in the last 168 hours. No results for input(s): LIPASE, AMYLASE in the last 168 hours. No results for input(s): AMMONIA in the last 168 hours. Coagulation Profile: No results for input(s): INR, PROTIME in the last 168 hours. Cardiac Enzymes: No results for input(s): CKTOTAL, CKMB, CKMBINDEX, TROPONINI in the last 168 hours. BNP (last 3 results) No results for input(s): PROBNP in the last 8760 hours. HbA1C: No results for input(s): HGBA1C in the last 72 hours. CBG: No results for input(s): GLUCAP in the last 168 hours. Lipid Profile: No results for input(s): CHOL, HDL, LDLCALC, TRIG, CHOLHDL, LDLDIRECT in the last 72 hours. Thyroid Function Tests: No results for input(s): TSH, T4TOTAL, FREET4, T3FREE, THYROIDAB in the last 72 hours. Anemia Panel: No results for input(s): VITAMINB12, FOLATE, FERRITIN, TIBC, IRON, RETICCTPCT in the last 72 hours. Urine analysis:    Component Value Date/Time   COLORURINE YELLOW 06/28/2010 2220   APPEARANCEUR CLEAR 06/28/2010 2220   LABSPEC 1.019 06/28/2010 2220   PHURINE 7.0 06/28/2010 2220   GLUCOSEU NEGATIVE 06/28/2010 2220   HGBUR TRACE (A) 06/28/2010 2220   BILIRUBINUR NEGATIVE 06/28/2010 2220   KETONESUR 15 (A) 06/28/2010 2220   PROTEINUR NEGATIVE 06/28/2010 2220   UROBILINOGEN 0.2 06/28/2010 2220   NITRITE NEGATIVE 06/28/2010 2220   LEUKOCYTESUR NEGATIVE 06/28/2010 2220   Sepsis Labs: @LABRCNTIP (procalcitonin:4,lacticidven:4) )No results found for this or any previous visit (from the past 240 hour(s)).   Radiological Exams on Admission: Ct Hand Left W Contrast  Result Date: 11/11/2018 CLINICAL DATA:  67 year old female with bug bite to the third digit of the left hand with progressive swelling and pain. Status post incision and  drainage. EXAM: CT OF THE UPPER LEFT EXTREMITY WITH CONTRAST TECHNIQUE: Multidetector CT imaging of the upper left extremity was performed according to the standard protocol following intravenous contrast administration. COMPARISON:  Left hand radiograph dated 11/10/2018 CONTRAST:  57m OMNIPAQUE IOHEXOL 300 MG/ML  SOLN FINDINGS: Bones/Joint/Cartilage There is no acute fracture or dislocation. There is  arthritic changes of the base of the thumb. No periosteal reaction or bone erosion. Ligaments Suboptimally assessed by CT. Muscles and Tendons No acute findings. No hematoma. Soft tissues There is diffuse subcutaneous edema primarily over the dorsum of the hand and second and third digits. There is stranding and edema of the fat plane in the carpal tunnel. There is a focal area of discontinuity of the skin in the dorsal aspect of the proximal phalanx of the third digit corresponding to the incision and drain. No drainable fluid collection identified. IMPRESSION: 1. Diffuse subcutaneous edema. No drainable fluid collection. Focal irregularity of the skin in the proximal third digit corresponds to the recent I & D. 2. No acute osseous pathology.  No evidence of acute osteomyelitis. Electronically Signed   By: Anner Crete M.D.   On: 11/11/2018 01:38   Dg Hand Complete Left  Result Date: 11/10/2018 CLINICAL DATA:  Bug bite left middle finger 5 days ago put on antibiotics with persistent redness and swelling. EXAM: LEFT HAND - COMPLETE 3+ VIEW COMPARISON:  None. FINDINGS: Examination demonstrates moderate soft tissue swelling over the middle finger adjacent the proximal phalanx. No air within the soft tissues. No radiopaque foreign body. Moderate degenerative change over the first carpometacarpal joint and mild degenerate change of the radiocarpal joint. IMPRESSION: Soft tissue swelling adjacent the third proximal phalanx. 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 Electronically Signed    By: Marin Olp M.D.   On: 11/10/2018 19:35     EKG:  Not done in ED, will get one.   Assessment/Plan Principal Problem:   Cellulitis and abscess of hand-left Active Problems:   Iron deficiency   AKI (acute kidney injury) ([place])   Cellulitis and abscess of hand-left: Patient has severe pain, but no fever chills.  Clinically not septic.  Orthopedic surgeon, Dr. Griffin Basil was consulted, concerning for possible early flexor tenosynovitis. Will need I&d in AM.  She failed outpatient antibiotic treatment.  - will place on med-surg for obs - Empiric antimicrobial treatment with vancomycin, Rocephin - PRN Zofran for nausea, morphine and Percocet for pain - Blood cult000ures x 2  - ESR and CRP - IVF: 125 cc/h when pt is on NPO - INR - f/u CT-left hand per Ortho  Iron deficiency: Hgb stable. 12.3. -f/u by CBC  Mild  AKI (acute kidney injury) ([place]): Creatinine 1.14, BUN 29, GFR 50. -IV fluid as above -Follow-up by BMP   DVT ppx: SCD Code Status: Full code Family Communication: None at bed side.    Disposition Plan:  Anticipate discharge back to previous home environment Consults called:  Ortho, Dr. Griffin Basil Admission status: medical floor/obs       Date of Service 11/11/2018    Ivor Costa [facility] Pager [phone]  If 7PM-7AM, please contact night-coverage [email or web address] Password [email or web address] 11/11/2018, 1:46 AM

## 2018-11-10 NOTE — ED Triage Notes (Signed)
Reports a bug bite on Tuesday to the left middle finger.  [facility] Septra.  Large amount of redness and swelling noted.  Denies having any fevers.

## 2018-11-10 NOTE — ED Triage Notes (Signed)
Pt presents with worsening redness and streaking since having a drain put in to her finger abscess to the middle digit of left hand.  Patient denies fevers.  Hand is warm to touch.

## 2018-11-10 NOTE — ED Notes (Signed)
Ortho physician at bedside.

## 2018-11-10 NOTE — Consult Note (Signed)
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    She had a bug bite in the last week or so she thinks and afterward on the dorsum of her long finger on the left hand and it progressively got more painful and swollen.  She had an I&D at the bedside in the ER where no cultures were taken that she had progressively worsening pain swelling and redness progressing into her hand.  She is worried about this as she was already taking Bactrim and not making much progress.  She said she had chills but no obvious measured fevers at home.  ER and hospitalist teams want orthopedic consultation for possible I&D in the operating room.  Past Medical History:  Diagnosis Date  . Arthritis   . Headache    hx of   Past Surgical History:  Procedure Laterality Date  . APPENDECTOMY    . CARPAL TUNNEL RELEASE    . EYE SURGERY     lasik eye   . HERNIA REPAIR    . JOINT REPLACEMENT     bilateral tka   12-31-17 Dr. Alvan Dame  . OLECRANON BURSECTOMY    . ORIF FEMUR FRACTURE Left 02/28/2018   Procedure: OPEN REDUCTION INTERNAL FIXATION (ORIF) LEFT DISTAL FEMUR FRACTURE;  Surgeon: Paralee Cancel, MD;  Location: [facility];  Service: Orthopedics;  Laterality: Left;  120 mins  . TONSILLECTOMY    . TOTAL KNEE ARTHROPLASTY Bilateral 12/31/2017   Procedure: TOTAL KNEE BILATERAL;  Surgeon: Paralee Cancel, MD;  Location: [facility];  Service: Orthopedics;  Laterality: Bilateral;  . TUBAL LIGATION     Social History   Socioeconomic History  . Marital status: Married    Spouse name: Not on file  . Number of children: Not on file  . Years of education: Not on file  . Highest education level: Not on file  Occupational History  . Not on file  Social Needs  . Financial resource  strain: Not on file  . Food insecurity:    Worry: Not on file    Inability: Not on file  . Transportation needs:    Medical: Not on file    Non-medical: Not on file  Tobacco Use  . Smoking status: Never Smoker  . Smokeless tobacco: Never Used  Substance and Sexual Activity  . Alcohol use: Yes    Comment: occasional wine  . Drug use: No  . Sexual activity: Yes  Lifestyle  . Physical activity:    Days per week: Not on file    Minutes per session: Not on file  . Stress: Not on file  Relationships  . Social connections:    Talks on phone: Not on file    Gets together: Not on file    Attends religious service: Not on file    Active member of club or organization: Not on file    Attends meetings of clubs or organizations: Not on file    Relationship status: Not on file  Other Topics Concern  . Not on file  Social History Narrative  . Not on file   History reviewed. No pertinent family history. No Known Allergies Prior to Admission medications   Medication Sig Start Date End Date Taking? Authorizing Provider  acetaminophen (TYLENOL) 325 MG tablet  Take 1-2 tablets (325-650 mg total) by mouth every 6 (six) hours as needed for mild pain (pain score 1-3 or temp > 100.5). 03/01/18  Yes Danae Orleans, PA-C  aspirin EC 325 MG EC tablet Take 1 tablet (325 mg total) by mouth 2 (two) times daily. 03/01/18  Yes Babish, Rodman Key, PA-C  BIOTIN PO Take 10,000 mg by mouth daily.    Yes [provider]  bisacodyl (DULCOLAX) 5 MG EC tablet Take 5 mg by mouth daily as needed for mild constipation or moderate constipation.    Yes [provider]  Calcium Carbonate-Vit D-Min (CALCIUM 1200 PO) Take 1,200 mg by mouth daily.   Yes [provider]  Cholecalciferol (VITAMIN D3) 1000 units CAPS Take 1,000 Units by mouth daily.   Yes [provider]  CINNAMON PO Take 1,000 mg by mouth daily.   Yes [provider]  gabapentin (NEURONTIN) 300 MG capsule Take 1,200  mg by mouth at bedtime as needed (pain).  09/18/18  Yes [provider]  Glucosamine HCl (GLUCOSAMINE PO) Take 1,000 mg by mouth daily.    Yes [provider]  Magnesium 250 MG TABS Take 250 mg by mouth daily.   Yes [provider]  Multiple Vitamins-Minerals (MULTIVITAMIN PO) Take 1 tablet by mouth daily.   Yes [provider]  pyridOXINE (VITAMIN B-6) 100 MG tablet Take 100 mg by mouth daily.   Yes [provider]  Turmeric 500 MG TABS Take 500 mg by mouth daily.   Yes [provider]  ferrous sulfate 325 (65 FE) MG tablet Take 1 tablet (325 mg total) by mouth 3 (three) times daily after meals. Patient not taking: Reported on 11/10/2018 03/01/18   Danae Orleans, PA-C  methocarbamol (ROBAXIN) 500 MG tablet Take 1 tablet (500 mg total) by mouth every 6 (six) hours as needed for muscle spasms. Patient not taking: Reported on 11/10/2018 03/01/18   Danae Orleans, PA-C  oxyCODONE 10 MG TABS Take 1-1.5 tablets (10-15 mg total) by mouth every 4 (four) hours as needed for severe pain (pain score 7-10). Patient not taking: Reported on 11/10/2018 03/01/18   Danae Orleans, PA-C  polyethylene glycol (MIRALAX / Floria Raveling) packet Take 17 g by mouth 2 (two) times daily. Patient not taking: Reported on 11/10/2018 03/01/18   Danae Orleans, PA-C  zolpidem (AMBIEN) 5 MG tablet Take 1 tablet (5 mg total) by mouth at bedtime as needed for sleep. Patient not taking: Reported on 11/10/2018 03/01/18   Danae Orleans, PA-C   Dg Hand Complete Left  Result Date: 11/10/2018 CLINICAL DATA:  Bug bite left middle finger 5 days ago put on antibiotics with persistent redness and swelling. EXAM: LEFT HAND - COMPLETE 3+ VIEW COMPARISON:  None. FINDINGS: Examination demonstrates moderate soft tissue swelling over the middle finger adjacent the proximal phalanx. No air within the soft tissues. No radiopaque foreign body. Moderate degenerative change over the first carpometacarpal  joint and mild degenerate change of the radiocarpal joint. IMPRESSION: Soft tissue swelling adjacent the third proximal phalanx. 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 Electronically Signed   By: Marin Olp M.D.   On: 11/10/2018 19:35   Family History Reviewed and non-contributory, no pertinent history of problems with bleeding or anesthesia      Review of Systems 14 system ROS conducted and negative except for that noted in HPI   OBJECTIVE  Vitals: Patient Vitals for the past 8 hrs:  BP Temp Temp src Pulse Resp SpO2  11/10/18  1829 121/78 98.4 F (36.9 C) Oral 78 16 97 %   General: Alert, no acute distress Cardiovascular: No pedal edema Respiratory: No cyanosis, no use of accessory musculature GI: No organomegaly, abdomen is soft and non-tender Skin: No lesions in the area of chief complaint other than those listed below in MSK exam.  Neurologic: Sensation intact distally save for the below mentioned MSK exam Psychiatric: Patient is competent for consent with normal mood and affect Lymphatic: No axillary or cervical lymphadenopathy Extremities  Left upper extremity demonstrates redness to the dorsum of the hand to the level of the wrist.  She has an obvious area of its fluctuant on the dorsum of the hand just distal to the MP joint of the long finger.  There is redness and pain and swelling within the flexor side of the hand of the long finger as well.  She does not have pain with passive extension however.  Compartments are soft.  Distal motor and sensory function is completely preserved in this finger but she has some numbness in her adjacent index finger due to a previous injury.    Test Results Imaging X-rays demonstrate soft tissue swelling of the left hand but no other acute osseous abnormalities.  Labs cbc Recent Labs    11/10/18 1919  WBC 8.6  HGB 12.3  HCT 38.6  PLT 317    Labs inflam No results for input(s): CRP in the last 72  hours.  Invalid input(s): ESR  Labs coag No results for input(s): INR, PTT in the last 72 hours.  Invalid input(s): PT  Recent Labs    11/10/18 1919  NA 137  K 4.0  CL 104  CO2 22  GLUCOSE 146*  BUN 29*  CREATININE 1.14*  CALCIUM 9.2     ASSESSMENT AND PLAN: 67 y.o. female with the following: Left long finger dorsal hand abscess and possible early flexor tenosynovitis again involving the long finger  Patient would definitely benefit from admission and IV antibiotics per the medical team.  Recommend broad-spectrum coverage but defer to their expertise as there are no existing cultures.  She would likely require an incision and debridement in the operating room and a possible incision and debridement of the flexor side of the hand as well.  Due to her relationship with [facility] orthopedics she is asked that we at least discussed the case with them to see if someone from their practice would be willing to participate.  If not she understands that I will be available to help with her case.  We talked about specific risks including septic joints continued infection and need for further surgeries and vessel injury and stiffness.  She understood all of these things elected to proceed.  Plan for I&D of the dorsum of the hand on the left and a flexor sheath washout tomorrow.  N.p.o. after midnight.  Broad-spectrum IV antibiotics per medical team.

## 2018-11-10 NOTE — ED Notes (Signed)
Pt. Back from xray

## 2018-11-10 NOTE — ED Notes (Signed)
Pt transported to xray.  Will start antibiotics upon return

## 2018-11-10 NOTE — ED Provider Notes (Addendum)
[facility]   CSN: [id number] Arrival date & time: 11/10/18  1825     History   Chief Complaint Chief Complaint  Patient presents with  . Wound Infection    HPI [name] is a 67 y.o. female.  The history is provided by the patient and medical records. No language interpreter was used.   [name] is a 67 y.o. female who presents to the Emergency Department complaining of worsening redness, swelling and pain to the left hand. Patient believes that she was bitten by something on 26 November.  She was started on Bactrim for likely cellulitis of the area on the 29th.  She was seen late last night/early this morning in the emergency department for abscess which was I&D.  She reports the large amount of purulent discharge was expressed, packing placed and she felt much better.  She was told to continue her Bactrim which she has done.  This morning when she awoke, her hand was much more swollen.  It has persistently gotten worse throughout the day and she noticed red streaking up her hand now.  She denies any fever or chills.  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    Past Medical History:  Diagnosis Date  . Arthritis   . Headache    hx of    Patient Active Problem List   Diagnosis Date Noted  . Iron deficiency 11/10/2018  . Cellulitis and abscess of hand-left 11/10/2018  . AKI (acute kidney injury) (HCC) 11/10/2018  . Closed fracture of distal end of left femur (HCC) 02/28/2018  . 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 APPENDECTOMY    . CARPAL TUNNEL RELEASE    . EYE SURGERY     lasik eye   . HERNIA REPAIR    . JOINT REPLACEMENT     bilateral tka   12-31-17 Dr. Charlann Boxerlin  . OLECRANON BURSECTOMY    . ORIF FEMUR FRACTURE Left 02/28/2018   Procedure:  OPEN REDUCTION INTERNAL FIXATION (ORIF) LEFT DISTAL FEMUR FRACTURE;  Surgeon: Durene Romanslin, Matthew, MD;  Location: [facility];  Service: Orthopedics;  Laterality: Left;  120 mins  . TONSILLECTOMY    . TOTAL KNEE ARTHROPLASTY Bilateral 12/31/2017   Procedure: TOTAL KNEE BILATERAL;  Surgeon: Durene Romanslin, Matthew, MD;  Location: [facility];  Service: Orthopedics;  Laterality: Bilateral;  . TUBAL LIGATION       OB History   None      Home Medications    Prior to Admission medications   Medication Sig Start Date End Date Taking? Authorizing Provider  acetaminophen (TYLENOL) 325 MG tablet Take 1-2 tablets (325-650 mg total) by mouth every 6 (six) hours as needed for mild pain (pain score 1-3 or temp > 100.5). 03/01/18  Yes Lanney GinsBabish, Matthew, PA-C  aspirin EC 325 MG EC tablet Take 1 tablet (325 mg total) by mouth 2 (two) times daily. 03/01/18  Yes Babish, Molli HazardMatthew, PA-C  BIOTIN PO Take 10,000 mg by mouth daily.    Yes [provider]  bisacodyl (DULCOLAX) 5 MG EC tablet Take 5 mg by mouth daily as needed for mild constipation or moderate constipation.    Yes [provider]  Calcium Carbonate-Vit D-Min (CALCIUM 1200 PO) Take 1,200 mg by mouth daily.   Yes [provider]  Cholecalciferol (VITAMIN D3) 1000 units CAPS Take 1,000 Units by mouth daily.   Yes [provider]  CINNAMON PO Take 1,000 mg by mouth daily.   Yes [provider]  gabapentin (NEURONTIN) 300 MG capsule Take 1,200 mg by mouth at bedtime as needed (pain).  09/18/18  Yes [provider]  Glucosamine HCl (GLUCOSAMINE PO) Take 1,000 mg by mouth daily.    Yes [provider]  Magnesium 250 MG TABS Take 250 mg by mouth daily.   Yes [provider]  Multiple Vitamins-Minerals (MULTIVITAMIN PO) Take 1 tablet by mouth daily.   Yes [provider]  pyridOXINE (VITAMIN B-6) 100 MG tablet Take 100 mg by mouth daily.   Yes [provider]  Turmeric 500 MG TABS Take 500 mg by  mouth daily.   Yes [provider]  ferrous sulfate 325 (65 FE) MG tablet Take 1 tablet (325 mg total) by mouth 3 (three) times daily after meals. Patient not taking: Reported on 11/10/2018 03/01/18   Lanney Gins, PA-C  methocarbamol (ROBAXIN) 500 MG tablet Take 1 tablet (500 mg total) by mouth every 6 (six) hours as needed for muscle spasms. Patient not taking: Reported on 11/10/2018 03/01/18   Lanney Gins, PA-C  oxyCODONE 10 MG TABS Take 1-1.5 tablets (10-15 mg total) by mouth every 4 (four) hours as needed for severe pain (pain score 7-10). Patient not taking: Reported on 11/10/2018 03/01/18   Lanney Gins, PA-C  polyethylene glycol (MIRALAX / Ethelene Hal) packet Take 17 g by mouth 2 (two) times daily. Patient not taking: Reported on 11/10/2018 03/01/18   Lanney Gins, PA-C  zolpidem (AMBIEN) 5 MG tablet Take 1 tablet (5 mg total) by mouth at bedtime as needed for sleep. Patient not taking: Reported on 11/10/2018 03/01/18   Lanney Gins, PA-C    Family History History reviewed. No pertinent family history.  Social History Social History   Tobacco Use  . Smoking status: Never Smoker  . Smokeless tobacco: Never Used  Substance Use Topics  . Alcohol use: Yes    Comment: occasional wine  . Drug use: No     Allergies   Patient has no known allergies.   Review of Systems Review of Systems  Musculoskeletal: Positive for arthralgias and myalgias.  Skin: Positive for color change and wound.  All other systems reviewed and are negative.    Physical Exam Updated Vital Signs BP 121/78 (BP Location: Right Arm)   Pulse 78   Temp 98.4 F (36.9 C) (Oral)   Resp 16   SpO2 97%   Physical Exam  Constitutional: She is oriented to person, place, and time. She appears well-developed and well-nourished. No distress.  HENT:  Head: Normocephalic and atraumatic.  Cardiovascular: Normal rate, regular rhythm and normal heart sounds.  No murmur heard. Pulmonary/Chest:  Effort normal and breath sounds normal. No respiratory distress.  Abdominal: Soft. She exhibits no distension. There is no tenderness.  Musculoskeletal:  Erythema and induration to the dorsal aspect of the hand with streaking up left forearm.  Prior I&D site packing removed with small amount of purulent drainage expressed.  No tenderness to the flexor tendons /palm.  2+ radial pulse.  Sensation intact.  Neurological: She is alert and oriented to person, place, and time.  Skin: Skin is warm and dry.  Nursing note and vitals reviewed.          ED Treatments / Results  Labs (all labs ordered are listed, but only abnormal results are  displayed) Labs Reviewed  BASIC METABOLIC PANEL - Abnormal; Notable for the following components:      Result Value   Glucose, Bld 146 (*)    BUN 29 (*)    Creatinine, Ser 1.14 (*)    GFR calc non Af Amer 50 (*)    GFR calc Af Amer 58 (*)    All other components within normal limits  CBC WITH DIFFERENTIAL/PLATELET    EKG None  Radiology Dg Hand Complete Left  Result Date: 11/10/2018 CLINICAL DATA:  Bug bite left middle finger 5 days ago put on antibiotics with persistent redness and swelling. EXAM: LEFT HAND - COMPLETE 3+ VIEW COMPARISON:  None. FINDINGS: Examination demonstrates moderate soft tissue swelling over the middle finger adjacent the proximal phalanx. No air within the soft tissues. No radiopaque foreign body. Moderate degenerative change over the first carpometacarpal joint and mild degenerate change of the radiocarpal joint. IMPRESSION: Soft tissue swelling adjacent the third proximal phalanx. 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 Electronically Signed   By: Elberta Fortis M.D.   On: 11/10/2018 19:35    Procedures Procedures (including critical care time)  CRITICAL CARE Performed by: Chase Picket Ward   Total critical care time: 35 minutes  Critical care time was exclusive of separately billable  procedures and treating other patients.  Critical care was necessary to treat or prevent imminent or life-threatening deterioration.  Critical care was time spent personally by me on the following activities: development of treatment plan with patient and/or surrogate as well as nursing, discussions with consultants, evaluation of patient's response to treatment, examination of patient, obtaining history from patient or surrogate, ordering and performing treatments and interventions, ordering and review of laboratory studies, ordering and review of radiographic studies, pulse oximetry and re-evaluation of patient's condition.   Medications Ordered in ED Medications  0.9 %  sodium chloride infusion ( Intravenous New Bag/Given 11/10/18 1945)  vancomycin (VANCOCIN) IVPB 1000 mg/200 mL premix (has no administration in time range)  vancomycin (VANCOCIN) IVPB 1000 mg/200 mL premix (1,000 mg Intravenous New Bag/Given 11/10/18 1946)  ketorolac (TORADOL) 30 MG/ML injection 15 mg (15 mg Intravenous Given 11/10/18 1921)  morphine 4 MG/ML injection 4 mg (4 mg Intravenous Given 11/10/18 1922)  ondansetron (ZOFRAN) injection 4 mg (4 mg Intravenous Given 11/10/18 1920)     Initial Impression / Assessment and Plan / ED Course  I have reviewed the triage vital signs and the nursing notes.  Pertinent labs & imaging results that were available during my care of the patient were reviewed by me and considered in my medical decision making (see chart for details).    [name] is a 67 y.o. female who presents to ED for worsening redness and swelling to the left hand despite antibiotics and I&D very early this morning.  Afebrile with normal white count.  Vitals stable.  Does not appear septic.  Does have significant worsening in her symptoms compared to photos that she showed me from yesterday.  She has a history of MRSA. Given a dose of vancomycin in the ED today.  Hospitalist consulted who will admit.  8:42  PM - Consult to hand surgery ordered.  8:49 PM - Notified that Dr. Everardo Pacific is in surgery and a message was left for him to call back once surgery is completed. Asked for estimated timeframe, secretary will try to gather this information.  9:10 PM - Spoke with Dr. Everardo Pacific of orthopedics. He recommends CT of the hand, NPO  after midnight and likely I&D tomorrow.   Patient seen by and discussed with Dr. Anitra Lauth who agrees with treatment plan.    Final Clinical Impressions(s) / ED Diagnoses   Final diagnoses:  Cellulitis of finger of left hand    ED Discharge Orders    None       Ward, Chase Picket, PA-C 11/10/18 2112    Gwyneth Sprout, MD 11/10/18 2126    Ward, Chase Picket, PA-C 11/19/18 1406    Gwyneth Sprout, MD 11/22/18 2057

## 2018-11-10 NOTE — ED Provider Notes (Signed)
[facility] EMERGENCY DEPARTMENT Provider Note   CSN: [id number] Arrival date & time: 11/10/18  0449     History   Chief Complaint Chief Complaint  Patient presents with  . Wound Infection    HPI [name] is a 67 y.o. female.  The history is provided by the patient. No language interpreter was used.  Abscess  Location:  Finger Finger abscess location:  L long finger Abscess quality: fluctuance, painful, redness and weeping (serous drainage)   Red streaking: no   Duration:  5 days Progression:  Partially resolved Pain details:    Quality:  Throbbing   Severity:  Mild   Duration:  5 days   Timing:  Constant   Progression:  Worsening Chronicity:  New Context: insect bite/sting (suspected)   Context: not diabetes   Relieved by: Partial improvement with Bactrim. Associated symptoms: no fever   Risk factors: hx of MRSA (knee bursa in 2011)   Risk factors: no prior abscess     Past Medical History:  Diagnosis Date  . Arthritis   . Headache    hx of    Patient Active Problem List   Diagnosis Date Noted  . Closed fracture of distal end of left femur (HCC) 02/28/2018  . 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 APPENDECTOMY    . CARPAL TUNNEL RELEASE    . EYE SURGERY     lasik eye   . HERNIA REPAIR    . JOINT REPLACEMENT     bilateral tka   12-31-17 Dr. Charlann Boxer  . OLECRANON BURSECTOMY    . ORIF FEMUR FRACTURE Left 02/28/2018   Procedure: OPEN REDUCTION INTERNAL FIXATION (ORIF) LEFT DISTAL FEMUR FRACTURE;  Surgeon: Durene Romans, MD;  Location: [facility];  Service: Orthopedics;  Laterality: Left;  120 mins  . TONSILLECTOMY    . TOTAL KNEE ARTHROPLASTY Bilateral 12/31/2017   Procedure: TOTAL KNEE BILATERAL;  Surgeon: Durene Romans, MD;  Location: [facility];  Service: Orthopedics;  Laterality: Bilateral;  .  TUBAL LIGATION       OB History   None      Home Medications    Prior to Admission medications   Medication Sig Start Date End Date Taking? Authorizing Provider  acetaminophen (TYLENOL) 325 MG tablet Take 1-2 tablets (325-650 mg total) by mouth every 6 (six) hours as needed for mild pain (pain score 1-3 or temp > 100.5). 03/01/18   Lanney Gins, PA-C  aspirin EC 325 MG EC tablet Take 1 tablet (325 mg total) by mouth 2 (two) times daily. 03/01/18   Lanney Gins, PA-C  Biotin 10 MG TABS Take 10 mg by mouth daily.    [provider]  bisacodyl (DULCOLAX) 5 MG EC tablet Take 5 mg by mouth daily as needed for mild constipation or moderate constipation.     [provider]  Calcium Carbonate-Vit D-Min (CALCIUM 1200 PO) Take 1,200 mg by mouth daily.    [provider]  Cholecalciferol (VITAMIN D3) 1000 units CAPS Take 1,000 Units by mouth daily.    [provider]  CINNAMON PO Take 1,000 mg by mouth daily.    [provider]  ferrous sulfate 325 (65 FE) MG tablet Take 1 tablet (325 mg total) by mouth 3 (three) times daily after meals. 03/01/18   Lanney Gins, PA-C  Glucosamine HCl (GLUCOSAMINE  PO) Take 1,000 mg by mouth daily.     [provider]  Magnesium 250 MG TABS Take 250 mg by mouth daily.    [provider]  methocarbamol (ROBAXIN) 500 MG tablet Take 1 tablet (500 mg total) by mouth every 6 (six) hours as needed for muscle spasms. 03/01/18   Lanney Gins, PA-C  Multiple Vitamins-Minerals (MULTIVITAMIN PO) Take 1 tablet by mouth daily.    [provider]  oxyCODONE 10 MG TABS Take 1-1.5 tablets (10-15 mg total) by mouth every 4 (four) hours as needed for severe pain (pain score 7-10). 03/01/18   Lanney Gins, PA-C  polyethylene glycol (MIRALAX / GLYCOLAX) packet Take 17 g by mouth 2 (two) times daily. 03/01/18   Lanney Gins, PA-C  pyridOXINE (VITAMIN B-6) 100 MG tablet Take 100 mg by mouth daily.     [provider]  Turmeric 500 MG TABS Take 500 mg by mouth daily.    [provider]  zolpidem (AMBIEN) 5 MG tablet Take 1 tablet (5 mg total) by mouth at bedtime as needed for sleep. 03/01/18   Lanney Gins, PA-C    Family History No family history on file.  Social History Social History   Tobacco Use  . Smoking status: Never Smoker  . Smokeless tobacco: Never Used  Substance Use Topics  . Alcohol use: Yes    Comment: occasional wine  . Drug use: No     Allergies   Patient has no known allergies.   Review of Systems Review of Systems  Constitutional: Negative for fever.  Ten systems reviewed and are negative for acute change, except as noted in the HPI.    Physical Exam Updated Vital Signs BP 109/74   Pulse 65   Temp 97.9 F (36.6 C) (Oral)   Ht 5\' 5"  (1.651 m)   Wt 59 kg   SpO2 99%   BMI 21.63 kg/m   Physical Exam  Constitutional: She is oriented to person, place, and time. She appears well-developed and well-nourished. No distress.  Nontoxic appearing and in NAD  HENT:  Head: Normocephalic and atraumatic.  Eyes: Conjunctivae and EOM are normal. No scleral icterus.  Neck: Normal range of motion.  Cardiovascular:  Capillary refill brisk in all digits of the L hand.  Pulmonary/Chest: Effort normal. No respiratory distress.  Respirations even and unlabored  Musculoskeletal: Normal range of motion.  Erythema and induration to the L middle finger associated with proximal phalanx. Pustule noted to the dorsal surface of the digit. No lymphangitic streaking or induration/erythema extending to the dorsum of the hand. Normal flexion and extension of the affected hand and digits.  Neurological: She is alert and oriented to person, place, and time.  Skin: Skin is warm and dry. She is not diaphoretic. No pallor.  Psychiatric: She has a normal mood and affect. Her behavior is normal.  Nursing note and vitals reviewed.    ED Treatments / Results    Labs (all labs ordered are listed, but only abnormal results are displayed) Labs Reviewed - No data to display  EKG None  Radiology No results found.  Procedures Procedures (including critical care time)  INCISION AND DRAINAGE Performed by: Antony Madura Consent: Verbal consent obtained. Risks and benefits: risks, benefits and alternatives were discussed Type: abscess  Body area: L middle finger  Anesthesia: digital block  Incision was made with a scalpel.  Local anesthetic: lidocaine 2% without epinephrine  Anesthetic total: 4 ml  Complexity: complex Blunt dissection to break  up loculations  Drainage: purulent  Drainage amount: moderate  Packing material: 1/4 in iodoform gauze  Patient tolerance: Patient tolerated the procedure well with no immediate complications.   Medications Ordered in ED Medications  lidocaine (XYLOCAINE) 2 % (with pres) injection 100 mg (100 mg Infiltration Given 11/10/18 0525)     Initial Impression / Assessment and Plan / ED Course  I have reviewed the triage vital signs and the nursing notes.  Pertinent labs & imaging results that were available during my care of the patient were reviewed by me and considered in my medical decision making (see chart for details).     Patient with skin abscess amenable to incision and drainage.  Abscess packed with 1/4" iodoform.  Wound recheck in 2 days. Encouraged home warm soaks and flushing.  Mild signs of cellulitis is surrounding skin.  Will d/c to home with instruction to continue Bactrim as previously prescribed.  Return precautions discussed and provided.  Patient discharged in stable condition with no unaddressed concerns.   Final Clinical Impressions(s) / ED Diagnoses   Final diagnoses:  Abscess of left middle finger    ED Discharge Orders    None       Antony MaduraHumes, Calel Pisarski, PA-C 11/10/18 [id number]    Shaune PollackIsaacs, Cameron, MD 11/11/18 1930

## 2018-11-10 NOTE — Progress Notes (Addendum)
Pharmacy Antibiotic Note  [name] is a 67 y.o. female  with cellulitis.  Pharmacy has been consulted for vancomycin dosing. -SCr= 1.14, CrCL ~ 45  Plan: -vancomycin 1000mg  IV q24h -Will follow renal function and clinical progress  Temp (24hrs), Avg:98.2 F (36.8 C), Min:97.9 F (36.6 C), Max:98.4 F (36.9 C)  No results for input(s): WBC, CREATININE, LATICACIDVEN, VANCOTROUGH, VANCOPEAK, VANCORANDOM, GENTTROUGH, GENTPEAK, GENTRANDOM, TOBRATROUGH, TOBRAPEAK, TOBRARND, AMIKACINPEAK, AMIKACINTROU, AMIKACIN in the last 168 hours.  CrCl cannot be calculated (Patient's most recent lab result is older than the maximum 21 days allowed.).    No Known Allergies  Antimicrobials this admission: 12/1 vanc>>   Thank you for allowing pharmacy to be a part of this patient's care.  Harland GermanAndrew Reynard Christoffersen, PharmD Clinical Pharmacist **Pharmacist phone directory can now be found on [email or web address] ([email or web address]).  Listed under [facility].

## 2018-11-10 NOTE — Discharge Instructions (Addendum)
Continue Bactrim as previously prescribed.  We recommend warm soaks or compresses to promote drainage.  Change the dressing over top of your wound at least once per day to keep the area clean and dry.  Have packing removed in 2 days by your primary care doctor.  If packing falls out prior to this, do not try to replace it; however you should still visit your primary care doctor for wound recheck in this instance.  Return to the emergency department for new or concerning symptoms.

## 2018-11-11 ENCOUNTER — Inpatient Hospital Stay (HOSPITAL_COMMUNITY): Payer: No Typology Code available for payment source | Admitting: Certified Registered Nurse Anesthetist

## 2018-11-11 ENCOUNTER — Observation Stay (HOSPITAL_COMMUNITY): Payer: No Typology Code available for payment source

## 2018-11-11 ENCOUNTER — Encounter (HOSPITAL_COMMUNITY)
Admission: EM | Disposition: A | Discharge status: Home / Self Care | Payer: Self-pay | Source: Ambulatory Visit | Attending: Internal Medicine

## 2018-11-11 ENCOUNTER — Encounter (HOSPITAL_COMMUNITY): Payer: Self-pay | Admitting: Internal Medicine

## 2018-11-11 DIAGNOSIS — D509 Iron deficiency anemia, unspecified: Secondary | ICD-10-CM | POA: Diagnosis present

## 2018-11-11 DIAGNOSIS — B9562 Methicillin resistant Staphylococcus aureus infection as the cause of diseases classified elsewhere: Secondary | ICD-10-CM | POA: Diagnosis present

## 2018-11-11 DIAGNOSIS — L03119 Cellulitis of unspecified part of limb: Secondary | ICD-10-CM | POA: Diagnosis not present

## 2018-11-11 DIAGNOSIS — R739 Hyperglycemia, unspecified: Secondary | ICD-10-CM | POA: Diagnosis present

## 2018-11-11 DIAGNOSIS — L03012 Cellulitis of left finger: Secondary | ICD-10-CM | POA: Diagnosis present

## 2018-11-11 DIAGNOSIS — L02512 Cutaneous abscess of left hand: Secondary | ICD-10-CM | POA: Diagnosis present

## 2018-11-11 DIAGNOSIS — N179 Acute kidney failure, unspecified: Secondary | ICD-10-CM | POA: Diagnosis present

## 2018-11-11 DIAGNOSIS — L02519 Cutaneous abscess of unspecified hand: Secondary | ICD-10-CM | POA: Diagnosis not present

## 2018-11-11 DIAGNOSIS — Z96653 Presence of artificial knee joint, bilateral: Secondary | ICD-10-CM | POA: Diagnosis present

## 2018-11-11 DIAGNOSIS — Z79899 Other long term (current) drug therapy: Secondary | ICD-10-CM | POA: Diagnosis not present

## 2018-11-11 DIAGNOSIS — Z7982 Long term (current) use of aspirin: Secondary | ICD-10-CM | POA: Diagnosis not present

## 2018-11-11 DIAGNOSIS — L03114 Cellulitis of left upper limb: Secondary | ICD-10-CM | POA: Diagnosis present

## 2018-11-11 HISTORY — PX: I & D EXTREMITY: SHX5045

## 2018-11-11 LAB — PROTIME-INR
INR: 1.1
Prothrombin Time: 14.1 seconds (ref 11.4–15.2)

## 2018-11-11 LAB — CBC
HEMATOCRIT: 32.5 % — AB (ref 36.0–46.0)
Hemoglobin: 10.6 g/dL — ABNORMAL LOW (ref 12.0–15.0)
MCH: 30.5 pg (ref 26.0–34.0)
MCHC: 32.6 g/dL (ref 30.0–36.0)
MCV: 93.7 fL (ref 80.0–100.0)
Platelets: 228 10*3/uL (ref 150–400)
RBC: 3.47 MIL/uL — ABNORMAL LOW (ref 3.87–5.11)
RDW: 12.9 % (ref 11.5–15.5)
WBC: 6.1 10*3/uL (ref 4.0–10.5)
nRBC: 0 % (ref 0.0–0.2)

## 2018-11-11 LAB — HIV ANTIBODY (ROUTINE TESTING W REFLEX): HIV Screen 4th Generation wRfx: NONREACTIVE

## 2018-11-11 SURGERY — IRRIGATION AND DEBRIDEMENT EXTREMITY
Anesthesia: General | Laterality: Left

## 2018-11-11 MED ORDER — ACETAMINOPHEN 160 MG/5ML PO SOLN: 325.0000 mg | ORAL | Status: DC | PRN

## 2018-11-11 MED ORDER — IOHEXOL 300 MG/ML  SOLN
75.0000 mL | Freq: Once | INTRAMUSCULAR | Status: AC | PRN
  Administered 2018-11-11: 75 mL via INTRAVENOUS

## 2018-11-11 MED ORDER — MEPERIDINE HCL 50 MG/ML IJ SOLN: 6.2500 mg | INTRAMUSCULAR | Status: DC | PRN

## 2018-11-11 MED ORDER — BUPIVACAINE HCL (PF) 0.25 % IJ SOLN
INTRAMUSCULAR | Status: AC
  Filled 2018-11-11: qty 30

## 2018-11-11 MED ORDER — EPHEDRINE SULFATE 50 MG/ML IJ SOLN
INTRAMUSCULAR | Status: DC | PRN
  Administered 2018-11-11 (×2): 5 mg via INTRAVENOUS

## 2018-11-11 MED ORDER — ENOXAPARIN SODIUM 40 MG/0.4ML ~~LOC~~ SOLN
40.0000 mg | SUBCUTANEOUS | Status: DC
  Administered 2018-11-11 – 2018-11-12 (×2): 40 mg via SUBCUTANEOUS
  Filled 2018-11-11 (×2): qty 0.4

## 2018-11-11 MED ORDER — SODIUM CHLORIDE 0.9 % IR SOLN
Status: DC | PRN
  Administered 2018-11-11: 3000 mL

## 2018-11-11 MED ORDER — LIDOCAINE 2% (20 MG/ML) 5 ML SYRINGE
INTRAMUSCULAR | Status: DC | PRN
  Administered 2018-11-11: 100 mg via INTRAVENOUS

## 2018-11-11 MED ORDER — LACTATED RINGERS IV SOLN
INTRAVENOUS | Status: DC
  Administered 2018-11-11: 13:00:00 via INTRAVENOUS

## 2018-11-11 MED ORDER — LIDOCAINE 2% (20 MG/ML) 5 ML SYRINGE
INTRAMUSCULAR | Status: AC
  Filled 2018-11-11: qty 5

## 2018-11-11 MED ORDER — FENTANYL CITRATE (PF) 250 MCG/5ML IJ SOLN
INTRAMUSCULAR | Status: DC | PRN
  Administered 2018-11-11: 100 ug via INTRAVENOUS

## 2018-11-11 MED ORDER — CEFAZOLIN SODIUM 1 G IJ SOLR
INTRAMUSCULAR | Status: AC
  Filled 2018-11-11: qty 20

## 2018-11-11 MED ORDER — KETOROLAC TROMETHAMINE 15 MG/ML IJ SOLN
INTRAMUSCULAR | Status: AC
  Filled 2018-11-11: qty 1

## 2018-11-11 MED ORDER — MIDAZOLAM HCL 2 MG/2ML IJ SOLN
INTRAMUSCULAR | Status: AC
  Filled 2018-11-11: qty 2

## 2018-11-11 MED ORDER — OXYCODONE HCL 5 MG PO TABS: 5.0000 mg | ORAL_TABLET | Freq: Once | ORAL | Status: DC | PRN

## 2018-11-11 MED ORDER — DEXAMETHASONE SODIUM PHOSPHATE 10 MG/ML IJ SOLN
INTRAMUSCULAR | Status: DC | PRN
  Administered 2018-11-11: 10 mg via INTRAVENOUS

## 2018-11-11 MED ORDER — ONDANSETRON HCL 4 MG/2ML IJ SOLN: 4.0000 mg | Freq: Once | INTRAMUSCULAR | Status: DC | PRN

## 2018-11-11 MED ORDER — KETOROLAC TROMETHAMINE 15 MG/ML IJ SOLN
15.0000 mg | Freq: Once | INTRAMUSCULAR | Status: AC
  Administered 2018-11-11: 15 mg via INTRAVENOUS

## 2018-11-11 MED ORDER — 0.9 % SODIUM CHLORIDE (POUR BTL) OPTIME
TOPICAL | Status: DC | PRN
  Administered 2018-11-11: 1000 mL

## 2018-11-11 MED ORDER — ONDANSETRON HCL 4 MG/2ML IJ SOLN
INTRAMUSCULAR | Status: DC | PRN
  Administered 2018-11-11: 4 mg via INTRAVENOUS

## 2018-11-11 MED ORDER — PROPOFOL 10 MG/ML IV BOLUS
INTRAVENOUS | Status: AC
  Filled 2018-11-11: qty 20

## 2018-11-11 MED ORDER — PROPOFOL 10 MG/ML IV BOLUS
INTRAVENOUS | Status: DC | PRN
  Administered 2018-11-11: 150 mg via INTRAVENOUS

## 2018-11-11 MED ORDER — OXYCODONE HCL 5 MG/5ML PO SOLN: 5.0000 mg | Freq: Once | ORAL | Status: DC | PRN

## 2018-11-11 MED ORDER — ACETAMINOPHEN 325 MG PO TABS: 325.0000 mg | ORAL_TABLET | ORAL | Status: DC | PRN

## 2018-11-11 MED ORDER — CEFAZOLIN SODIUM-DEXTROSE 2-3 GM-%(50ML) IV SOLR
INTRAVENOUS | Status: DC | PRN
  Administered 2018-11-11: 2 g via INTRAVENOUS

## 2018-11-11 MED ORDER — FENTANYL CITRATE (PF) 250 MCG/5ML IJ SOLN
INTRAMUSCULAR | Status: AC
  Filled 2018-11-11: qty 5

## 2018-11-11 MED ORDER — FENTANYL CITRATE (PF) 100 MCG/2ML IJ SOLN
INTRAMUSCULAR | Status: AC
  Filled 2018-11-11: qty 2

## 2018-11-11 MED ORDER — MIDAZOLAM HCL 2 MG/2ML IJ SOLN
INTRAMUSCULAR | Status: DC | PRN
  Administered 2018-11-11: 2 mg via INTRAVENOUS

## 2018-11-11 MED ORDER — KCL IN DEXTROSE-NACL 20-5-0.45 MEQ/L-%-% IV SOLN
INTRAVENOUS | Status: DC
  Administered 2018-11-11: 18:00:00 via INTRAVENOUS
  Filled 2018-11-11 (×2): qty 1000

## 2018-11-11 MED ORDER — FENTANYL CITRATE (PF) 100 MCG/2ML IJ SOLN
25.0000 ug | INTRAMUSCULAR | Status: DC | PRN
  Administered 2018-11-11: 25 ug via INTRAVENOUS

## 2018-11-11 SURGICAL SUPPLY — 63 items
BANDAGE ACE 3X5.8 VEL STRL LF (GAUZE/BANDAGES/DRESSINGS) ×2 IMPLANT
BANDAGE ACE 4X5 VEL STRL LF (GAUZE/BANDAGES/DRESSINGS) ×2 IMPLANT
BNDG CMPR 9X4 STRL LF SNTH (GAUZE/BANDAGES/DRESSINGS) ×1
BNDG COHESIVE 1X5 TAN STRL LF (GAUZE/BANDAGES/DRESSINGS) IMPLANT
BNDG CONFORM 2 STRL LF (GAUZE/BANDAGES/DRESSINGS) ×1 IMPLANT
BNDG ELASTIC 2X5.8 VLCR STR LF (GAUZE/BANDAGES/DRESSINGS) ×1 IMPLANT
BNDG ESMARK 4X9 LF (GAUZE/BANDAGES/DRESSINGS) ×2 IMPLANT
BNDG GAUZE ELAST 4 BULKY (GAUZE/BANDAGES/DRESSINGS) ×2 IMPLANT
BUCKET CAST 5QT PAPER WAX WHT (CAST SUPPLIES) ×1 IMPLANT
CORD BIPOLAR FORCEPS 12FT (ELECTRODE) ×1 IMPLANT
CORDS BIPOLAR (ELECTRODE) ×1 IMPLANT
COVER SURGICAL LIGHT HANDLE (MISCELLANEOUS) ×2 IMPLANT
COVER WAND RF STERILE (DRAPES) ×1 IMPLANT
CUFF TOURNIQUET SINGLE 18IN (TOURNIQUET CUFF) ×2 IMPLANT
CUFF TOURNIQUET SINGLE 24IN (TOURNIQUET CUFF) IMPLANT
DRAIN PENROSE 1/4X12 LTX STRL (WOUND CARE) IMPLANT
DRAPE SURG 17X23 STRL (DRAPES) ×2 IMPLANT
DRSG ADAPTIC 3X8 NADH LF (GAUZE/BANDAGES/DRESSINGS) ×1 IMPLANT
ELECT REM PT RETURN 9FT ADLT (ELECTROSURGICAL) ×2
ELECTRODE REM PT RTRN 9FT ADLT (ELECTROSURGICAL) IMPLANT
GAUZE PACKING IODOFORM 1/4X15 (GAUZE/BANDAGES/DRESSINGS) ×1 IMPLANT
GAUZE SPONGE 4X4 12PLY STRL (GAUZE/BANDAGES/DRESSINGS) ×2 IMPLANT
GAUZE SPONGE 4X4 12PLY STRL LF (GAUZE/BANDAGES/DRESSINGS) ×1 IMPLANT
GAUZE XEROFORM 1X8 LF (GAUZE/BANDAGES/DRESSINGS) ×1 IMPLANT
GAUZE XEROFORM 5X9 LF (GAUZE/BANDAGES/DRESSINGS) IMPLANT
GLOVE BIOGEL PI IND STRL 8.5 (GLOVE) ×1 IMPLANT
GLOVE BIOGEL PI INDICATOR 8.5 (GLOVE) ×1
GLOVE SURG ORTHO 8.0 STRL STRW (GLOVE) ×2 IMPLANT
GOWN STRL REUS W/ TWL LRG LVL3 (GOWN DISPOSABLE) ×3 IMPLANT
GOWN STRL REUS W/ TWL XL LVL3 (GOWN DISPOSABLE) ×1 IMPLANT
GOWN STRL REUS W/TWL LRG LVL3 (GOWN DISPOSABLE) ×2
GOWN STRL REUS W/TWL XL LVL3 (GOWN DISPOSABLE) ×2
HANDPIECE INTERPULSE COAX TIP (DISPOSABLE)
KIT BASIN OR (CUSTOM PROCEDURE TRAY) ×2 IMPLANT
KIT TURNOVER KIT B (KITS) ×2 IMPLANT
MANIFOLD NEPTUNE II (INSTRUMENTS) ×2 IMPLANT
NDL HYPO 25GX1X1/2 BEV (NEEDLE) IMPLANT
NEEDLE HYPO 25GX1X1/2 BEV (NEEDLE) IMPLANT
NS IRRIG 1000ML POUR BTL (IV SOLUTION) ×2 IMPLANT
PACK ORTHO EXTREMITY (CUSTOM PROCEDURE TRAY) ×2 IMPLANT
PAD ARMBOARD 7.5X6 YLW CONV (MISCELLANEOUS) ×3 IMPLANT
PAD CAST 4YDX4 CTTN HI CHSV (CAST SUPPLIES) ×1 IMPLANT
PADDING CAST COTTON 4X4 STRL (CAST SUPPLIES) ×2
SET CYSTO W/LG BORE CLAMP LF (SET/KITS/TRAYS/PACK) ×1 IMPLANT
SET HNDPC FAN SPRY TIP SCT (DISPOSABLE) IMPLANT
SOAP 2 % CHG 4 OZ (WOUND CARE) ×2 IMPLANT
SPLINT PLASTER EXTRA FAST 3X15 (CAST SUPPLIES) ×1
SPLINT PLASTER GYPS XFAST 3X15 (CAST SUPPLIES) IMPLANT
SPONGE LAP 18X18 X RAY DECT (DISPOSABLE) ×2 IMPLANT
SPONGE LAP 4X18 RFD (DISPOSABLE) ×2 IMPLANT
SUT ETHILON 4 0 PS 2 18 (SUTURE) IMPLANT
SUT ETHILON 5 0 P 3 18 (SUTURE)
SUT NYLON ETHILON 5-0 P-3 1X18 (SUTURE) IMPLANT
SUT PROLENE 4 0 PS 2 18 (SUTURE) ×1 IMPLANT
SWAB COLLECTION DEVICE MRSA (MISCELLANEOUS) ×2 IMPLANT
SWAB CULTURE ESWAB REG 1ML (MISCELLANEOUS) ×1 IMPLANT
SYR CONTROL 10ML LL (SYRINGE) IMPLANT
TOWEL OR 17X24 6PK STRL BLUE (TOWEL DISPOSABLE) ×1 IMPLANT
TOWEL OR 17X26 10 PK STRL BLUE (TOWEL DISPOSABLE) ×2 IMPLANT
TUBE CONNECTING 12X1/4 (SUCTIONS) ×2 IMPLANT
UNDERPAD 30X30 (UNDERPADS AND DIAPERS) ×2 IMPLANT
WATER STERILE IRR 1000ML POUR (IV SOLUTION) ×1 IMPLANT
YANKAUER SUCT BULB TIP NO VENT (SUCTIONS) ×2 IMPLANT

## 2018-11-11 NOTE — Care Management Note (Signed)
Case Management Note  Patient Details  Name: [name] MRN: [id number] Date of Birth: [birth year]  Subjective/Objective:  67 yo female presents with cellulitis and abscess of her L hand.                 Action/Plan: CM met with patient/spouse to discuss transitional needs. Patient lives at home with her spouse, independent with ADLs PTA. PCP verified as Dr[facility] Jon[facility] of choice: [facility]. Patient NPO for tentative procedure. CM left a VM with Drue Stager (Team [place]), [place] to provide an update on patients admission. Clinical progress notes should be faxed to: [phone]. CM team will continue to follow.      Expected Discharge Date:                  Expected Discharge Plan:  Home/Self Care  In-House Referral:  NA  Discharge planning Services  CM Consult  Post Acute Care Choice:  NA Choice offered to:  NA  DME Arranged:  N/A DME Agency:  NA  HH Arranged:  NA HH Agency:  NA  Status of Service:  In process, will continue to follow  If discussed at Long Length of Stay Meetings, dates discussed:    Additional Comments:  Midge Minium RN, BSN, NCM-BC, ACM-RN [phone] 11/11/2018, 12:41 PM

## 2018-11-11 NOTE — Progress Notes (Signed)
Received pt from PACU. Patient alert and oriented x4. Left hand/arm wrapped in ACE wrap. Patient able to move all fingers and has full sensation. C/o some minor numbness in left thumb. Will continue to monitor.   Patient oriented to call bell and bed controls.

## 2018-11-11 NOTE — Anesthesia Preprocedure Evaluation (Signed)
Anesthesia Evaluation  Patient identified by MRN, date of birth, ID band Patient awake    Reviewed: Allergy & Precautions, NPO status , Patient's Chart, lab work & pertinent test results  Airway Mallampati: I       Dental no notable dental hx. (+) Teeth Intact   Pulmonary neg pulmonary ROS,    Pulmonary exam normal breath sounds clear to auscultation       Cardiovascular negative cardio ROS Normal cardiovascular exam Rhythm:Regular Rate:Normal     Neuro/Psych negative psych ROS   GI/Hepatic negative GI ROS, Neg liver ROS,   Endo/Other  negative endocrine ROS  Renal/GU   negative genitourinary   Musculoskeletal   Abdominal Normal abdominal exam  (+)   Peds  Hematology negative hematology ROS (+)   Anesthesia Other Findings   Reproductive/Obstetrics                             Anesthesia Physical Anesthesia Plan  ASA: II  Anesthesia Plan: General   Post-op Pain Management:    Induction: Intravenous  PONV Risk Score and Plan: 4 or greater and Ondansetron, Dexamethasone and Midazolam  Airway Management Planned: LMA  Additional Equipment:   Intra-op Plan:   Post-operative Plan: Extubation in OR  Informed Consent: I have reviewed the patients History and Physical, chart, labs and discussed the procedure including the risks, benefits and alternatives for the proposed anesthesia with the patient or authorized representative who has indicated his/her understanding and acceptance.   Dental advisory given  Plan Discussed with: CRNA  Anesthesia Plan Comments:         Anesthesia Quick Evaluation

## 2018-11-11 NOTE — Transfer of Care (Signed)
Immediate Anesthesia Transfer of Care Note  Patient: [name]  Procedure(s) Performed: IRRIGATION AND DEBRIDEMENT LEFT HAND EXTREMITY (Left )  Patient Location: PACU  Anesthesia Type:General  Level of Consciousness: awake, alert  and oriented  Airway & Oxygen Therapy: Patient Spontanous Breathing and Patient connected to nasal cannula oxygen  Post-op Assessment: Report given to RN and Post -op Vital signs reviewed and stable  Post vital signs: Reviewed and stable  Last Vitals:  Vitals Value Taken Time  BP 105/62 11/11/2018  3:04 PM  Temp    Pulse 69 11/11/2018  3:05 PM  Resp 13 11/11/2018  3:05 PM  SpO2 100 % 11/11/2018  3:05 PM  Vitals shown include unvalidated device data.  Last Pain:  Vitals:   11/11/18 1302  TempSrc:   PainSc: 5          Complications: No apparent anesthesia complications

## 2018-11-11 NOTE — Progress Notes (Signed)
TC to Pre-op with report .

## 2018-11-11 NOTE — Progress Notes (Addendum)
[facility] PROGRESS NOTE  [name] DOB: 1951/03/18 DOA: 11/10/2018   PCP: Rodney Langton, MD     Assessment/Plan: Principal Problem:   Cellulitis and abscess of hand-left Active Problems:   Iron deficiency   AKI (acute kidney injury) ([place])  [name] is a 67 y.o. female with medical history significant of iron deficiency anemia, arthritis, who presents with left hand pain.patient admitted for cellulitis of the left hand and started on empiric IV antibiotics. Now s/p #1: Left long finger incision and drainage of dorsal abscess #2: Left long finger extensor tenosynovectomy  Assessment and plan Cellulitis and abscess of hand-left: Patient presented with severe pain, but no fever chills.  Clinically not septic.  Orthopedic surgeon, Dr. Griffin Basil was consulted, concerning for possible early flexor tenosynovitis. S/P I&d  today. She failed outpatient antibiotic treatment. she would definitely benefit from continued  inpatient admission and IV antibiotics with broad-spectrum coverage. Scheduled for incision and drainage today -continue Empiric antimicrobial treatment with vancomycin, Rocephin, follow cultures from incision and drainage/blood culture - PRN Zofran for nausea, morphine and Percocet for pain - Blood cultures x 2  - ESR 25 and CRP 3.9 - CT of the left hand shows diffuse subcutaneous edema with no drainable fluid collection.  Iron deficiency: Hgb stable. 12.3 >10.6. -f/u by CBC   Mild  AKI (acute kidney injury) [facility]): Creatinine 1.14, BUN 29, GFR 50. -IV fluid as above -Follow-up by BMP    DVT prophylaxsis  Lovenox  Code Status: FULL CODE    Family Communication: Discussed in detail with the patient, all imaging results, lab results explained to the patient   Disposition Plan: continue antibiotics pending culture data,I and D today, follow cx      Consultants:  ORTHOPEDICS  Procedures:  NONE  Antibiotics: Anti-infectives (From admission, onward)   Start     Dose/Rate Route Frequency Ordered Stop   11/11/18 2130  vancomycin (VANCOCIN) IVPB 1000 mg/200 mL premix     1,000 mg 200 mL/hr over 60 Minutes Intravenous Every 24 hours 11/10/18 2040     11/11/18 0600  ceFAZolin (ANCEF) IVPB 2g/100 mL premix     2 g 200 mL/hr over 30 Minutes Intravenous On call to O.R. 11/10/18 2219 11/12/18 0559   11/10/18 1900  vancomycin (VANCOCIN) IVPB 1000 mg/200 mL premix     1,000 mg 200 mL/hr over 60 Minutes Intravenous  Once 11/10/18 1851 11/10/18 2114         HPI/Subjective: Swelling moving towards  forearm , but pain is controlled  Objective: Vitals:   11/10/18 1829 11/10/18 2229 11/11/18 0003 11/11/18 0543  BP: 121/78  (!) 102/56 (!) 95/53  Pulse: 78 70 67 (!) 58  Resp: 16 18 16    Temp: 98.4 F (36.9 C) 97.8 F (36.6 C) 97.7 F (36.5 C) 97.8 F (36.6 C)  TempSrc: Oral Oral Oral Oral  SpO2: 97% 100% 100% 99%  Weight:  59 kg    Height:  5' 5"  (1.651 m)      Intake/Output Summary (Last 24 hours) at 11/11/2018 4401 Last data filed at 11/10/2018 2330 Gross per 24 hour  Intake 240 ml  Output -  Net 240 ml    Exam:  Examination:  General exam: Appears calm and comfortable  Respiratory system: Clear to auscultation. Respiratory effort normal. Cardiovascular system: S1 & S2 heard, RRR. No JVD, murmurs, rubs, gallops or clicks. No pedal edema. Gastrointestinal system: Abdomen is nondistended, soft and nontender.  No organomegaly or masses felt. Normal bowel sounds heard. Central nervous system: Alert and oriented. No focal neurological deficits. Extremities: swelling of the 4th digit with eschar formation and swelling of hand and forearm  Skin: No rashes, lesions or ulcers Psychiatry: Judgement and insight appear normal. Mood & affect appropriate.     Data Reviewed: I have personally reviewed following labs and  imaging studies  Micro Results No results found for this or any previous visit (from the past 240 hour(s)).  Radiology Reports Ct Hand Left W Contrast  Result Date: 11/11/2018 CLINICAL DATA:  67 year old female with bug bite to the third digit of the left hand with progressive swelling and pain. Status post incision and drainage. EXAM: CT OF THE UPPER LEFT EXTREMITY WITH CONTRAST TECHNIQUE: Multidetector CT imaging of the upper left extremity was performed according to the standard protocol following intravenous contrast administration. COMPARISON:  Left hand radiograph dated 11/10/2018 CONTRAST:  8m OMNIPAQUE IOHEXOL 300 MG/ML  SOLN FINDINGS: Bones/Joint/Cartilage There is no acute fracture or dislocation. There is arthritic changes of the base of the thumb. No periosteal reaction or bone erosion. Ligaments Suboptimally assessed by CT. Muscles and Tendons No acute findings. No hematoma. Soft tissues There is diffuse subcutaneous edema primarily over the dorsum of the hand and second and third digits. There is stranding and edema of the fat plane in the carpal tunnel. There is a focal area of discontinuity of the skin in the dorsal aspect of the proximal phalanx of the third digit corresponding to the incision and drain. No drainable fluid collection identified. IMPRESSION: 1. Diffuse subcutaneous edema. No drainable fluid collection. Focal irregularity of the skin in the proximal third digit corresponds to the recent I & D. 2. No acute osseous pathology.  No evidence of acute osteomyelitis. Electronically Signed   By: AAnner CreteM.D.   On: 11/11/2018 01:38   Dg Hand Complete Left  Result Date: 11/10/2018 CLINICAL DATA:  Bug bite left middle finger 5 days ago put on antibiotics with persistent redness and swelling. EXAM: LEFT HAND - COMPLETE 3+ VIEW COMPARISON:  None. FINDINGS: Examination demonstrates moderate soft tissue swelling over the middle finger adjacent the proximal phalanx. No air  within the soft tissues. No radiopaque foreign body. Moderate degenerative change over the first carpometacarpal joint and mild degenerate change of the radiocarpal joint. IMPRESSION: Soft tissue swelling adjacent the third proximal phalanx. 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 Electronically Signed   By: DMarin OlpM.D.   On: 11/10/2018 19:35     CBC Recent Labs  Lab 11/10/18 1919 11/11/18 0518  WBC 8.6 6.1  HGB 12.3 10.6*  HCT 38.6 32.5*  PLT 317 228  MCV 93.0 93.7  MCH 29.6 30.5  MCHC 31.9 32.6  RDW 12.8 12.9  LYMPHSABS 0.9  --   MONOABS 0.7  --   EOSABS 0.5  --   BASOSABS 0.0  --     Chemistries  Recent Labs  Lab 11/10/18 1919  NA 137  K 4.0  CL 104  CO2 22  GLUCOSE 146*  BUN 29*  CREATININE 1.14*  CALCIUM 9.2   ------------------------------------------------------------------------------------------------------------------ estimated creatinine clearance is 43.7 mL/min (A) (by C-G formula based on SCr of 1.14 mg/dL (H)). ------------------------------------------------------------------------------------------------------------------ No results for input(s): HGBA1C in the last 72 hours. ------------------------------------------------------------------------------------------------------------------ No results for input(s): CHOL, HDL, LDLCALC, TRIG, CHOLHDL, LDLDIRECT in the last 72 hours. ------------------------------------------------------------------------------------------------------------------ No results for input(s): TSH, T4TOTAL, T3FREE, THYROIDAB in the last 72 hours.  Invalid  input(s): FREET3 ------------------------------------------------------------------------------------------------------------------ No results for input(s): VITAMINB12, FOLATE, FERRITIN, TIBC, IRON, RETICCTPCT in the last 72 hours.  Coagulation profile Recent Labs  Lab 11/11/18 0518  INR 1.10    No results for input(s): DDIMER in the last  72 hours.  Cardiac Enzymes No results for input(s): CKMB, TROPONINI, MYOGLOBIN in the last 168 hours.  Invalid input(s): CK ------------------------------------------------------------------------------------------------------------------ Invalid input(s): POCBNP   CBG: No results for input(s): GLUCAP in the last 168 hours.     Studies: Ct Hand Left W Contrast  Result Date: 11/11/2018 CLINICAL DATA:  67 year old female with bug bite to the third digit of the left hand with progressive swelling and pain. Status post incision and drainage. EXAM: CT OF THE UPPER LEFT EXTREMITY WITH CONTRAST TECHNIQUE: Multidetector CT imaging of the upper left extremity was performed according to the standard protocol following intravenous contrast administration. COMPARISON:  Left hand radiograph dated 11/10/2018 CONTRAST:  77m OMNIPAQUE IOHEXOL 300 MG/ML  SOLN FINDINGS: Bones/Joint/Cartilage There is no acute fracture or dislocation. There is arthritic changes of the base of the thumb. No periosteal reaction or bone erosion. Ligaments Suboptimally assessed by CT. Muscles and Tendons No acute findings. No hematoma. Soft tissues There is diffuse subcutaneous edema primarily over the dorsum of the hand and second and third digits. There is stranding and edema of the fat plane in the carpal tunnel. There is a focal area of discontinuity of the skin in the dorsal aspect of the proximal phalanx of the third digit corresponding to the incision and drain. No drainable fluid collection identified. IMPRESSION: 1. Diffuse subcutaneous edema. No drainable fluid collection. Focal irregularity of the skin in the proximal third digit corresponds to the recent I & D. 2. No acute osseous pathology.  No evidence of acute osteomyelitis. Electronically Signed   By: AAnner CreteM.D.   On: 11/11/2018 01:38   Dg Hand Complete Left  Result Date: 11/10/2018 CLINICAL DATA:  Bug bite left middle finger 5 days ago put on antibiotics  with persistent redness and swelling. EXAM: LEFT HAND - COMPLETE 3+ VIEW COMPARISON:  None. FINDINGS: Examination demonstrates moderate soft tissue swelling over the middle finger adjacent the proximal phalanx. No air within the soft tissues. No radiopaque foreign body. Moderate degenerative change over the first carpometacarpal joint and mild degenerate change of the radiocarpal joint. IMPRESSION: Soft tissue swelling adjacent the third proximal phalanx. 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 Electronically Signed   By: DMarin OlpM.D.   On: 11/10/2018 19:35      No results found for: HGBA1C Lab Results  Component Value Date   CREATININE 1.14 (H) 11/10/2018       Scheduled Meds: . aspirin  325 mg Oral BID  . calcium carbonate  1 tablet Oral Daily  . chlorhexidine  60 mL Topical Once  . cholecalciferol  1,000 Units Oral Daily  . magnesium oxide  200 mg Oral Daily  . multivitamin   Oral Daily  . pyridOXINE  100 mg Oral Daily   Continuous Infusions: . sodium chloride 125 mL/hr at 11/11/18 0726  .  ceFAZolin (ANCEF) IV    . vancomycin       LOS: 0 days    Time spent: >30 MINS    NReyne Dumas [facility] Pager [phone] If 7PM-7AM, please contact night-coverage at [email or web address], password T[facility]12/01/2018, 7:33 AM  LOS: 0 days

## 2018-11-11 NOTE — Anesthesia Procedure Notes (Signed)
Procedure Name: LMA Insertion Date/Time: 11/11/2018 2:25 PM Performed by: Alvera NovelPike, Charlyn Vialpando H, CRNA Pre-anesthesia Checklist: Patient identified, Emergency Drugs available, Suction available and Patient being monitored Patient Re-evaluated:Patient Re-evaluated prior to induction Oxygen Delivery Method: Circle System Utilized Preoxygenation: Pre-oxygenation with 100% oxygen Induction Type: IV induction Ventilation: Mask ventilation without difficulty LMA: LMA inserted LMA Size: 4.0 Number of attempts: 1 Placement Confirmation: positive ETCO2 Tube secured with: Tape Dental Injury: Teeth and Oropharynx as per pre-operative assessment

## 2018-11-11 NOTE — Anesthesia Postprocedure Evaluation (Signed)
Anesthesia Post Note  Patient: [name]  Procedure(s) Performed: IRRIGATION AND DEBRIDEMENT LEFT HAND EXTREMITY (Left )     Patient location during evaluation: PACU Anesthesia Type: General Level of consciousness: awake and sedated Pain management: pain level controlled Vital Signs Assessment: post-procedure vital signs reviewed and stable Respiratory status: spontaneous breathing Cardiovascular status: stable Postop Assessment: no apparent nausea or vomiting Anesthetic complications: no    Last Vitals:  Vitals:   11/11/18 1520 11/11/18 1535  BP: 112/68 109/64  Pulse: 69 73  Resp: 14 17  Temp:    SpO2: 100% 97%    Last Pain:  Vitals:   11/11/18 1302  TempSrc:   PainSc: 5    Pain Goal:                 Caren MacadamJohn F Zyree Traynham Jr

## 2018-11-11 NOTE — Consult Note (Signed)
The patient's chart was reviewed. The patient is well-established with our office. I spoke with Dr. Everardo PacificVarkey this morning and agreed to take care of the patient's left hand. The patient does have the large dorsal abscess over the left long finger. Is my recommendation that the patient undergo irrigation and debridement possible exploration of the flexor sheath Risks of surgery include but not limited to bleeding infection damage to nearby nerves arteries or tendons persistent infection and need for further surgical intervention. Wound cultures will be taken Patient does have a history of MRSA We talked about the reason the rationale for loosely closing the wound over drains reassessing the wound on Wednesday for possible repeat I&D Patient voiced understanding the reason the rationale for the intervention  R/B/A DISCUSSED WITH PT IN HOSPITAL.  PT VOICED UNDERSTANDING OF PLAN CONSENT SIGNED DAY OF SURGERY PT SEEN AND EXAMINED PRIOR TO OPERATIVE PROCEDURE/DAY OF SURGERY SITE MARKED. QUESTIONS ANSWERED WILL REMAIN AN INPATIENT FOLLOWING SURGERY

## 2018-11-11 NOTE — Op Note (Signed)
PREOPERATIVE DIAGNOSIS: left long finger abscess  POSTOPERATIVE DIAGNOSIS:same  ATTENDING SURGEON:Dr. Bradly BienenstockFred Donoven Hensley is scrubbed and present for the entire procedure  ASSISTANT SURGEON:none  ANESTHESIA:Gen. Via LMA  OPERATIVE PROCEDURE: #1: Left long finger incision and drainage of dorsal abscess #2: Left long finger extensor tenosynovectomy  IMPLANTS: None  RADIOGRAPHIC INTERPRETATION:None  SURGICAL INDICATIONS:patient is a right-hand-dominant female with a worsening left long finger infection. Patient is recommended to undergo the above procedure. Risks of surgery include but not limited to bleeding infection damage to nearby nerves arteries or tendons loss of motion of the wrists and digits incomplete relief of symptoms and need for further surgical intervention  SURGICAL TECHNIQUE: patient is probably identified in the preoperative holding area marked for permanent marker made a left long finger to indicate correct operative site. Patient brought back to the operating placed supine on anesthesia and table where general anesthesia was administered. Patient received preoperative antibiotics prior any skin incision. Well-padded tourniquet was then placed on the left brachium and sealed with the appropriate drape. Left M is and prepped and draped in normal sterile fashion. Timeout was called the correct site was identified and procedure then begun.  A curvilinear incision made directly over the dorsal aspect long finger. Dissection carried down through the skin and subcutaneous tissue. Gross purulence was encountered from the extensor mechanism. Wound cultures were taken. Dissection was then carried down around the extensor mechanism as well as lateral bands. Did not be appear to be tracking volarly. The wound was thoroughly irrigated with the cystoscopy tubing. Tenosynovectomy was then carried out along the extensor mechanism removing the devitalized tissue along the extensor surface. The wound  was then thoroughly irrigated. Following this was closed loosely over packing gauze with simple Prolene suture. Xeroform dressing a sterile compressive bandage then applied. The patient was placed in well-padded volar splint and extubated taken recovery room in good condition.  POSTOPERATIVE PLAN:patient be admitted back to the internal medicine service. We'll look at the wound in 48 hours. See if she improves and if so she go home on oral antibiotics if not the patient will require repeat I&D.continue to follow her wound cultures

## 2018-11-11 NOTE — Progress Notes (Signed)
Husband out of room at time of departure to OR. Pt and husbands personal belongings at [facility] [employer] desk .

## 2018-11-12 ENCOUNTER — Encounter (HOSPITAL_COMMUNITY): Payer: Self-pay | Admitting: Orthopedic Surgery

## 2018-11-12 LAB — BASIC METABOLIC PANEL
Anion gap: 9 (ref 5–15)
BUN: 19 mg/dL (ref 8–23)
CALCIUM: 8.6 mg/dL — AB (ref 8.9–10.3)
CO2: 22 mmol/L (ref 22–32)
Chloride: 105 mmol/L (ref 98–111)
Creatinine, Ser: 1.06 mg/dL — ABNORMAL HIGH (ref 0.44–1.00)
GFR calc Af Amer: >60 mL/min (ref >60)
GFR calc non Af Amer: 55 mL/min — ABNORMAL LOW (ref >60)
Glucose, Bld: 207 mg/dL — ABNORMAL HIGH (ref 70–99)
Potassium: 5 mmol/L (ref 3.5–5.1)
Sodium: 136 mmol/L (ref 135–145)

## 2018-11-12 LAB — HEMOGLOBIN A1C
Hgb A1c MFr Bld: 5.3 % (ref 4.8–5.6)
Mean Plasma Glucose: 105.41 mg/dL

## 2018-11-12 LAB — MRSA PCR SCREENING: MRSA by PCR: POSITIVE — AB

## 2018-11-12 MED ORDER — SODIUM CHLORIDE 0.9 % IV SOLN: INTRAVENOUS | Status: DC

## 2018-11-12 MED ORDER — CALCIUM CARBONATE-VITAMIN D 500-200 MG-UNIT PO TABS
1.0000 | ORAL_TABLET | Freq: Every day | ORAL | Status: DC
  Administered 2018-11-12 – 2018-11-13 (×2): 1 via ORAL
  Filled 2018-11-12 (×2): qty 1

## 2018-11-12 MED ORDER — CHLORHEXIDINE GLUCONATE CLOTH 2 % EX PADS
6.0000 | MEDICATED_PAD | Freq: Every day | CUTANEOUS | Status: DC
  Administered 2018-11-12 – 2018-11-13 (×2): 6 via TOPICAL

## 2018-11-12 MED ORDER — MUPIROCIN 2 % EX OINT
1.0000 "application " | TOPICAL_OINTMENT | Freq: Two times a day (BID) | CUTANEOUS | Status: DC
  Administered 2018-11-12 – 2018-11-13 (×3): 1 via NASAL
  Filled 2018-11-12: qty 22

## 2018-11-12 MED ORDER — SODIUM CHLORIDE 0.9 % IV SOLN
INTRAVENOUS | Status: AC
  Administered 2018-11-12: 10:00:00 via INTRAVENOUS

## 2018-11-12 MED ORDER — ADULT MULTIVITAMIN W/MINERALS CH
1.0000 | ORAL_TABLET | Freq: Every day | ORAL | Status: DC
  Administered 2018-11-12 – 2018-11-13 (×2): 1 via ORAL
  Filled 2018-11-12 (×2): qty 1

## 2018-11-12 NOTE — Progress Notes (Signed)
[facility] PROGRESS NOTE  [name]:[id number] DOB: 1951/11/07 DOA: 11/10/2018   PCP: Daisy Floro, MD GUISELLE Hensley is a 67 y.o. female with medical history significant of iron deficiency anemia, arthritis, who presented with left hand pain.patient admitted for abscess/cellulitis of the finger/hand, started on empiric IV antibiotics. Now s/p #1: Left long finger incision and drainage of dorsal abscess #2: Left long finger extensor tenosynovectomy   Assessment/Plan:  Cellulitis and abscess of hand-left long finger -s/p incision and drainage of dorsal abscess and extensor tenosynovectomy yesterday -Continue IV vancomycin -Follow-up or cultures -Hand surgery following may need additional debridement tomorrow -Discontinue IV fluids  Hyperglycemia -Check hemoglobin A1c and CBGs  History of recurrent infections -Advised immunology follow-up to check immunoglobulin and complement levels  History of iron deficiency anemia -Stable  Mild acute kidney injury -Resolved  DVT prophylaxsis  Lovenox  Code Status: FULL CODE  Family Communication: Discussed in detail with the patient, no family at bedside  Disposition Plan: Per hand surgery    Consultants:  ORTHOPEDICS  Procedures: #1: Left long finger incision and drainage of dorsal abscess #2: Left long finger extensor tenosynovectomy  Antibiotics: Anti-infectives (From admission, onward)   Start     Dose/Rate Route Frequency Ordered Stop   11/11/18 2130  vancomycin (VANCOCIN) IVPB 1000 mg/200 mL premix     1,000 mg 200 mL/hr over 60 Minutes Intravenous Every 24 hours 11/10/18 2040     11/11/18 0600  ceFAZolin (ANCEF) IVPB 2g/100 mL premix  Status:  Discontinued     2 g 200 mL/hr over 30 Minutes Intravenous On call to O.R. 11/10/18 2219 11/11/18 1707   11/10/18 1900  vancomycin (VANCOCIN) IVPB 1000 mg/200 mL premix     1,000 mg 200 mL/hr over 60 Minutes Intravenous  Once 11/10/18 1851  11/10/18 2114         HPI/Subjective: -Doing better, swelling improving, hand in dressing and elevated  Objective: Vitals:   11/11/18 1619 11/11/18 1635 11/11/18 1710 11/12/18 0604  BP: 102/66 (!) 95/59 (!) 105/59 98/66  Pulse: 66 66 73 60  Resp: 14 (!) 22    Temp:   98 F (36.7 C) 98.1 F (36.7 C)  TempSrc:   Oral Oral  SpO2: 97% 95% 94% 100%  Weight:      Height:        Intake/Output Summary (Last 24 hours) at 11/12/2018 1201 Last data filed at 11/12/2018 4401 Gross per 24 hour  Intake 3179.78 ml  Output 25 ml  Net 3154.78 ml    Exam:  Examination:  Gen: Awake, Alert, Oriented X 3, no distress HEENT: PERRLA, Neck supple, no JVD Lungs: Good air movement bilaterally, CTAB CVS: RRR,No Gallops,Rubs or new Murmurs Abd: soft, Non tender, non distended, BS present Extremities: Left hand in a dressing, able to wiggle fingers, distal sensations and capillary refill is intact Skin: no new rashes Psychiatry: Judgement and insight appear normal. Mood & affect appropriate.     Data Reviewed: I have personally reviewed following labs and imaging studies  Micro Results Recent Results (from the past 240 hour(s))  Culture, blood (Routine X 2) w Reflex to ID Panel     Status: None (Preliminary result)   Collection Time: 11/10/18  9:59 PM  Result Value Ref Range Status   Specimen Description BLOOD RIGHT ARM  Final   Special Requests   Final    BOTTLES DRAWN AEROBIC AND ANAEROBIC Blood Culture results may not be optimal due  to an excessive volume of blood received in culture bottles   Culture   Final    NO GROWTH < 12 HOURS Performed at [facility], [address], [place], [place] [address]    Report Status PENDING  Incomplete  Culture, blood (Routine X 2) w Reflex to ID Panel     Status: None (Preliminary result)   Collection Time: 11/10/18 10:05 PM  Result Value Ref Range Status   Specimen Description BLOOD RIGHT HAND  Final   Special Requests   Final     BOTTLES DRAWN AEROBIC ONLY Blood Culture adequate volume   Culture   Final    NO GROWTH < 12 HOURS Performed at [facility], [address], [place], [place] [address]    Report Status PENDING  Incomplete  Aerobic/Anaerobic Culture (surgical/deep wound)     Status: None (Preliminary result)   Collection Time: 11/11/18  2:20 PM  Result Value Ref Range Status   Specimen Description ABSCESS LEFT FINGER  Final   Special Requests NONE  Final   Gram Stain   Final    FEW WBC PRESENT, PREDOMINANTLY PMN RARE GRAM POSITIVE COCCI Performed at [facility], [address], [place], [place] [address]    Culture MODERATE STAPHYLOCOCCUS AUREUS  Final   Report Status PENDING  Incomplete  MRSA PCR Screening     Status: Abnormal   Collection Time: 11/12/18 12:21 AM  Result Value Ref Range Status   MRSA by PCR POSITIVE (A) NEGATIVE Final    Comment:        The GeneXpert MRSA Assay (FDA approved for NASAL specimens only), is one component of a comprehensive MRSA colonization surveillance program. It is not intended to diagnose MRSA infection nor to guide or monitor treatment for MRSA infections. RESULT CALLED TO, READ BACK BY AND VERIFIED WITHCher Nakai RN 2130 11/12/18 A BROWNING Performed at [facility], [address] [place], [place] [address]     Radiology Reports Ct Hand Left W Contrast  Result Date: 11/11/2018 CLINICAL DATA:  67 year old female with bug bite to the third digit of the left hand with progressive swelling and pain. Status post incision and drainage. EXAM: CT OF THE UPPER LEFT EXTREMITY WITH CONTRAST TECHNIQUE: Multidetector CT imaging of the upper left extremity was performed according to the standard protocol following intravenous contrast administration. COMPARISON:  Left hand radiograph dated 11/10/2018 CONTRAST:  75mL OMNIPAQUE IOHEXOL 300 MG/ML  SOLN FINDINGS: Bones/Joint/Cartilage There is no acute fracture or dislocation. There is arthritic changes  of the base of the thumb. No periosteal reaction or bone erosion. Ligaments Suboptimally assessed by CT. Muscles and Tendons No acute findings. No hematoma. Soft tissues There is diffuse subcutaneous edema primarily over the dorsum of the hand and second and third digits. There is stranding and edema of the fat plane in the carpal tunnel. There is a focal area of discontinuity of the skin in the dorsal aspect of the proximal phalanx of the third digit corresponding to the incision and drain. No drainable fluid collection identified. IMPRESSION: 1. Diffuse subcutaneous edema. No drainable fluid collection. Focal irregularity of the skin in the proximal third digit corresponds to the recent I & D. 2. No acute osseous pathology.  No evidence of acute osteomyelitis. Electronically Signed   By: Elgie Collard M.D.   On: 11/11/2018 01:38   Dg Hand Complete Left  Result Date: 11/10/2018 CLINICAL DATA:  Bug bite left middle finger 5  days ago put on antibiotics with persistent redness and swelling. EXAM: LEFT HAND - COMPLETE 3+ VIEW COMPARISON:  None. FINDINGS: Examination demonstrates moderate soft tissue swelling over the middle finger adjacent the proximal phalanx. No air within the soft tissues. No radiopaque foreign body. Moderate degenerative change over the first carpometacarpal joint and mild degenerate change of the radiocarpal joint. IMPRESSION: Soft tissue swelling adjacent the third proximal phalanx. 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 Electronically Signed   By: Elberta Fortis M.D.   On: 11/10/2018 19:35     CBC Recent Labs  Lab 11/10/18 1919 11/11/18 0518  WBC 8.6 6.1  HGB 12.3 10.6*  HCT 38.6 32.5*  PLT 317 228  MCV 93.0 93.7  MCH 29.6 30.5  MCHC 31.9 32.6  RDW 12.8 12.9  LYMPHSABS 0.9  --   MONOABS 0.7  --   EOSABS 0.5  --   BASOSABS 0.0  --     Chemistries  Recent Labs  Lab 11/10/18 1919 11/12/18 0245  NA 137 136  K 4.0 5.0  CL 104 105  CO2  22 22  GLUCOSE 146* 207*  BUN 29* 19  CREATININE 1.14* 1.06*  CALCIUM 9.2 8.6*   ------------------------------------------------------------------------------------------------------------------ estimated creatinine clearance is 47 mL/min (A) (by C-G formula based on SCr of 1.06 mg/dL (H)). ------------------------------------------------------------------------------------------------------------------ Recent Labs    11/12/18 1007  HGBA1C 5.3   ------------------------------------------------------------------------------------------------------------------ No results for input(s): CHOL, HDL, LDLCALC, TRIG, CHOLHDL, LDLDIRECT in the last 72 hours. ------------------------------------------------------------------------------------------------------------------ No results for input(s): TSH, T4TOTAL, T3FREE, THYROIDAB in the last 72 hours.  Invalid input(s): FREET3 ------------------------------------------------------------------------------------------------------------------ No results for input(s): VITAMINB12, FOLATE, FERRITIN, TIBC, IRON, RETICCTPCT in the last 72 hours.  Coagulation profile Recent Labs  Lab 11/11/18 0518  INR 1.10    No results for input(s): DDIMER in the last 72 hours.  Cardiac Enzymes No results for input(s): CKMB, TROPONINI, MYOGLOBIN in the last 168 hours.  Invalid input(s): CK ------------------------------------------------------------------------------------------------------------------ Invalid input(s): POCBNP   CBG: No results for input(s): GLUCAP in the last 168 hours.     Studies: Ct Hand Left W Contrast  Result Date: 11/11/2018 CLINICAL DATA:  67 year old female with bug bite to the third digit of the left hand with progressive swelling and pain. Status post incision and drainage. EXAM: CT OF THE UPPER LEFT EXTREMITY WITH CONTRAST TECHNIQUE: Multidetector CT imaging of the upper left extremity was performed according to the standard  protocol following intravenous contrast administration. COMPARISON:  Left hand radiograph dated 11/10/2018 CONTRAST:  75mL OMNIPAQUE IOHEXOL 300 MG/ML  SOLN FINDINGS: Bones/Joint/Cartilage There is no acute fracture or dislocation. There is arthritic changes of the base of the thumb. No periosteal reaction or bone erosion. Ligaments Suboptimally assessed by CT. Muscles and Tendons No acute findings. No hematoma. Soft tissues There is diffuse subcutaneous edema primarily over the dorsum of the hand and second and third digits. There is stranding and edema of the fat plane in the carpal tunnel. There is a focal area of discontinuity of the skin in the dorsal aspect of the proximal phalanx of the third digit corresponding to the incision and drain. No drainable fluid collection identified. IMPRESSION: 1. Diffuse subcutaneous edema. No drainable fluid collection. Focal irregularity of the skin in the proximal third digit corresponds to the recent I & D. 2. No acute osseous pathology.  No evidence of acute osteomyelitis. Electronically Signed   By: Elgie Collard M.D.   On: 11/11/2018 01:38   Dg Hand Complete Left  Result Date:  11/10/2018 CLINICAL DATA:  Bug bite left middle finger 5 days ago put on antibiotics with persistent redness and swelling. EXAM: LEFT HAND - COMPLETE 3+ VIEW COMPARISON:  None. FINDINGS: Examination demonstrates moderate soft tissue swelling over the middle finger adjacent the proximal phalanx. No air within the soft tissues. No radiopaque foreign body. Moderate degenerative change over the first carpometacarpal joint and mild degenerate change of the radiocarpal joint. IMPRESSION: Soft tissue swelling adjacent the third proximal phalanx. 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 Electronically Signed   By: Elberta Fortisaniel  Boyle M.D.   On: 11/10/2018 19:35      Lab Results  Component Value Date   HGBA1C 5.3 11/12/2018   Lab Results  Component Value Date    CREATININE 1.06 (H) 11/12/2018       Scheduled Meds: . aspirin  325 mg Oral BID  . calcium-vitamin D  1 tablet Oral Q breakfast  . Chlorhexidine Gluconate Cloth  6 each Topical Q0600  . cholecalciferol  1,000 Units Oral Daily  . enoxaparin (LOVENOX) injection  40 mg Subcutaneous Q24H  . magnesium oxide  200 mg Oral Daily  . multivitamin with minerals  1 tablet Oral Daily  . mupirocin ointment  1 application Nasal BID  . pyridOXINE  100 mg Oral Daily   Continuous Infusions: . sodium chloride 75 mL/hr at 11/12/18 1006  . vancomycin 1,000 mg (11/11/18 2134)     LOS: 1 day    Time spent: 25 MINS    Elysse Polidore Jomarie LongsJoseph  [facility] Page via [email or web address]. If 7PM-7AM, please contact night-coverage at [email or web address], password [email or web address] 11/12/2018, 12:01 PM  LOS: 1 day

## 2018-11-12 NOTE — Care Management Note (Signed)
Case Management Note  Patient Details  Name: [name] MRN: [id number] Date of Birth: March 28, 1951  Subjective/Objective:                    Action/Plan: Spoke to patient at bedside. She is from home with husband. Travels a lot.   PCP was DR Daisy FloroAntoinette Wymer , however, DR Christella ScheuermannWymer has retired. [facility] due to being admitted to hospital.   Patient gave consent to NCM to fax clinicals and update PCP.   [name] Dr Valora CorporalEyo's fax number is  [phone]. Dr Valora CorporalEyo's nurse is CiscoMarylyn Harriston.   [facility] [place]      Expected Discharge Date:                  Expected Discharge Plan:  Home/Self Care  In-House Referral:  NA  Discharge planning Services  CM Consult  Post Acute Care Choice:  NA Choice offered to:  Patient  DME Arranged:  N/A DME Agency:  NA  HH Arranged:  NA HH Agency:  NA  Status of Service:  In process, will continue to follow  If discussed at Long Length of Stay Meetings, dates discussed:    Additional Comments:  [name], [name], RN 11/12/2018, 2:21 PM

## 2018-11-13 LAB — CBC
HCT: 34.2 % — ABNORMAL LOW (ref 36.0–46.0)
Hemoglobin: 10.9 g/dL — ABNORMAL LOW (ref 12.0–15.0)
MCH: 30.3 pg (ref 26.0–34.0)
MCHC: 31.9 g/dL (ref 30.0–36.0)
MCV: 95 fL (ref 80.0–100.0)
Platelets: 243 10*3/uL (ref 150–400)
RBC: 3.6 MIL/uL — ABNORMAL LOW (ref 3.87–5.11)
RDW: 12.7 % (ref 11.5–15.5)
WBC: 6.5 10*3/uL (ref 4.0–10.5)
nRBC: 0 % (ref 0.0–0.2)

## 2018-11-13 LAB — BASIC METABOLIC PANEL
Anion gap: 9 (ref 5–15)
BUN: 20 mg/dL (ref 8–23)
CALCIUM: 8.8 mg/dL — AB (ref 8.9–10.3)
CO2: 27 mmol/L (ref 22–32)
CREATININE: 1.01 mg/dL — AB (ref 0.44–1.00)
Chloride: 106 mmol/L (ref 98–111)
GFR calc Af Amer: >60 mL/min (ref >60)
GFR calc non Af Amer: 58 mL/min — ABNORMAL LOW (ref >60)
Glucose, Bld: 108 mg/dL — ABNORMAL HIGH (ref 70–99)
Potassium: 4.8 mmol/L (ref 3.5–5.1)
Sodium: 142 mmol/L (ref 135–145)

## 2018-11-13 MED ORDER — MUPIROCIN 2 % EX OINT: 1.0000 "application " | TOPICAL_OINTMENT | Freq: Two times a day (BID) | CUTANEOUS | 0 refills | Status: DC

## 2018-11-13 MED ORDER — DOXYCYCLINE HYCLATE 50 MG PO CAPS: 50.0000 mg | ORAL_CAPSULE | Freq: Two times a day (BID) | ORAL | 0 refills | Status: AC

## 2018-11-13 NOTE — Progress Notes (Signed)
Pt discharged home in stable condition after going over discharge teaching with no concerns voiced. Discharge paperwork given before leaving

## 2018-11-13 NOTE — Discharge Summary (Signed)
Physician Discharge Summary  [name] ZOX:[id number] DOB: 1951/06/20 DOA: 11/10/2018  PCP: Daisy FloroWymer, Antoinette, MD  Admit date: 11/10/2018 Discharge date: 11/13/2018  Admitted From: Home.  Disposition:  Home   Recommendations for Outpatient Follow-up:  1. Follow up with PCP in 1-2 weeks 2. Please obtain BMP/CBC in one week 3. Please follow up with orthopedics as recommended.     Discharge Condition:stable.  CODE STATUS:full code.  Diet recommendation: Heart Healthy Brief/Interim Summary: [name] a 67 y.o.femalewith medical history significant ofiron deficiency anemia, arthritis, who presented with left hand pain.patient admitted for abscess/cellulitis of the finger/hand, started on empiric IV antibiotics. Now s/p #1: Left long finger incision and drainage of dorsal abscess #2: Left long finger extensor tenosynovectomy  Discharge Diagnoses:  Principal Problem:   Cellulitis and abscess of hand-left Active Problems:   Iron deficiency   AKI (acute kidney injury) (HCC)   Cellulitis of finger of left hand  Cellulitis and abscess of hand-left long finger -s/p incision and drainage of dorsal abscess and extensor tenosynovectomy yesterday -cultures growing MRSA, Discussed with Dr Luciana Axeomer, and discharged her on one week of doxycycline to complete the course.  -Hand surgery follow up as outpatient.   Hyperglycemia  hemoglobin A1c is 5.3  History of recurrent infections -Advised immunology follow-up to check immunoglobulin and complement levels  History of iron deficiency anemia -Stable  Mild acute kidney injury -Resolved  Discharge Instructions  Discharge Instructions    Diet - low sodium heart healthy   Complete by:  As directed    Discharge instructions   Complete by:  As directed    Follow  up with Dr Melvyn Novasrtmann as recommended.     Allergies as of 11/13/2018   No Known Allergies     Medication List    STOP taking these medications   BIOTIN  PO   methocarbamol 500 MG tablet Commonly known as:  ROBAXIN   Oxycodone HCl 10 MG Tabs   polyethylene glycol packet Commonly known as:  MIRALAX / GLYCOLAX   zolpidem 5 MG tablet Commonly known as:  AMBIEN     TAKE these medications   acetaminophen 325 MG tablet Commonly known as:  TYLENOL Take 1-2 tablets (325-650 mg total) by mouth every 6 (six) hours as needed for mild pain (pain score 1-3 or temp > 100.5).   aspirin 325 MG EC tablet Take 1 tablet (325 mg total) by mouth 2 (two) times daily.   bisacodyl 5 MG EC tablet Commonly known as:  DULCOLAX Take 5 mg by mouth daily as needed for mild constipation or moderate constipation.   CALCIUM 1200 PO Take 1,200 mg by mouth daily.   CINNAMON PO Take 1,000 mg by mouth daily.   doxycycline 50 MG capsule Commonly known as:  VIBRAMYCIN Take 1 capsule (50 mg total) by mouth 2 (two) times daily for 8 days.   ferrous sulfate 325 (65 FE) MG tablet Take 1 tablet (325 mg total) by mouth 3 (three) times daily after meals.   gabapentin 300 MG capsule Commonly known as:  NEURONTIN Take 1,200 mg by mouth at bedtime as needed (pain).   GLUCOSAMINE PO Take 1,000 mg by mouth daily.   Magnesium 250 MG Tabs Take 250 mg by mouth daily.   MULTIVITAMIN PO Take 1 tablet by mouth daily.   mupirocin ointment 2 % Commonly known as:  BACTROBAN Place 1 application into the nose 2 (two) times daily.   pyridOXINE 100 MG tablet Commonly known as:  VITAMIN  B-6 Take 100 mg by mouth daily.   Turmeric 500 MG Tabs Take 500 mg by mouth daily.   Vitamin D3 25 MCG (1000 UT) Caps Take 1,000 Units by mouth daily.      Follow-up Information    Wymer, Sherry Ruffing, MD. Schedule an appointment as soon as possible for a visit in 1 week(s).   Specialty:  Internal Medicine Contact information: [address] [place] [address] [phone]        Bradly Bienenstock, MD. Schedule an appointment as soon as possible for a visit in 1  week(s).   Specialty:  Orthopedic Surgery Contact information: [address] [place] [address] [place] [address] [phone]          No Known Allergies  Consultations:  Orthopedics.    Procedures/Studies: Ct Hand Left W Contrast  Result Date: 11/11/2018 CLINICAL DATA:  67 year old female with bug bite to the third digit of the left hand with progressive swelling and pain. Status post incision and drainage. EXAM: CT OF THE UPPER LEFT EXTREMITY WITH CONTRAST TECHNIQUE: Multidetector CT imaging of the upper left extremity was performed according to the standard protocol following intravenous contrast administration. COMPARISON:  Left hand radiograph dated 11/10/2018 CONTRAST:  75mL OMNIPAQUE IOHEXOL 300 MG/ML  SOLN FINDINGS: Bones/Joint/Cartilage There is no acute fracture or dislocation. There is arthritic changes of the base of the thumb. No periosteal reaction or bone erosion. Ligaments Suboptimally assessed by CT. Muscles and Tendons No acute findings. No hematoma. Soft tissues There is diffuse subcutaneous edema primarily over the dorsum of the hand and second and third digits. There is stranding and edema of the fat plane in the carpal tunnel. There is a focal area of discontinuity of the skin in the dorsal aspect of the proximal phalanx of the third digit corresponding to the incision and drain. No drainable fluid collection identified. IMPRESSION: 1. Diffuse subcutaneous edema. No drainable fluid collection. Focal irregularity of the skin in the proximal third digit corresponds to the recent I & D. 2. No acute osseous pathology.  No evidence of acute osteomyelitis. Electronically Signed   By: Elgie Collard M.D.   On: 11/11/2018 01:38   Dg Hand Complete Left  Result Date: 11/10/2018 CLINICAL DATA:  Bug bite left middle finger 5 days ago put on antibiotics with persistent redness and swelling. EXAM: LEFT HAND - COMPLETE 3+ VIEW COMPARISON:  None. FINDINGS: Examination demonstrates  moderate soft tissue swelling over the middle finger adjacent the proximal phalanx. No air within the soft tissues. No radiopaque foreign body. Moderate degenerative change over the first carpometacarpal joint and mild degenerate change of the radiocarpal joint. IMPRESSION: Soft tissue swelling adjacent the third proximal phalanx. 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 Electronically Signed   By: Elberta Fortis M.D.   On: 11/10/2018 19:35    (Echo, Carotid, EGD, Colonoscopy, ERCP)    Subjective:   Discharge Exam: Vitals:   11/13/18 0652 11/13/18 1234  BP: 121/81 125/79  Pulse: 84 78  Resp: 16   Temp: (!) 97.3 F (36.3 C) 97.7 F (36.5 C)  SpO2: 95% 99%   Vitals:   11/12/18 1434 11/12/18 2206 11/13/18 0652 11/13/18 1234  BP: 117/67 99/63 121/81 125/79  Pulse: 70 70 84 78  Resp:  17 16   Temp: 98.4 F (36.9 C) 98 F (36.7 C) (!) 97.3 F (36.3 C) 97.7 F (36.5 C)  TempSrc: Oral Oral Oral Oral  SpO2: 99% 99% 95% 99%  Weight:  Height:        General: Pt is alert, awake, not in acute distress Cardiovascular: RRR, S1/S2 +, no rubs, no gallops Respiratory: CTA bilaterally, no wheezing, no rhonchi Abdominal: Soft, NT, ND, bowel sounds + Extremities: no edema, no cyanosis    The results of significant diagnostics from this hospitalization (including imaging, microbiology, ancillary and laboratory) are listed below for reference.     Microbiology: Recent Results (from the past 240 hour(s))  Culture, blood (Routine X 2) w Reflex to ID Panel     Status: None (Preliminary result)   Collection Time: 11/10/18  9:59 PM  Result Value Ref Range Status   Specimen Description BLOOD RIGHT ARM  Final   Special Requests   Final    BOTTLES DRAWN AEROBIC AND ANAEROBIC Blood Culture results may not be optimal due to an excessive volume of blood received in culture bottles   Culture   Final    NO GROWTH 3 DAYS Performed at [facility], [address] [place], [place] [address]    Report Status PENDING  Incomplete  Culture, blood (Routine X 2) w Reflex to ID Panel     Status: None (Preliminary result)   Collection Time: 11/10/18 10:05 PM  Result Value Ref Range Status   Specimen Description BLOOD RIGHT HAND  Final   Special Requests   Final    BOTTLES DRAWN AEROBIC ONLY Blood Culture adequate volume   Culture   Final    NO GROWTH 3 DAYS Performed at [facility], [address], [place], [place] [address]    Report Status PENDING  Incomplete  Aerobic/Anaerobic Culture (surgical/deep wound)     Status: None (Preliminary result)   Collection Time: 11/11/18  2:20 PM  Result Value Ref Range Status   Specimen Description ABSCESS LEFT FINGER  Final   Special Requests NONE  Final   Gram Stain   Final    FEW WBC PRESENT, PREDOMINANTLY PMN RARE GRAM POSITIVE COCCI Performed at [facility], [address], [place], [place] [address]    Culture   Final    MODERATE METHICILLIN RESISTANT STAPHYLOCOCCUS AUREUS NO ANAEROBES ISOLATED; CULTURE IN PROGRESS FOR 5 DAYS    Report Status PENDING  Incomplete   Organism ID, Bacteria METHICILLIN RESISTANT STAPHYLOCOCCUS AUREUS  Final      Susceptibility   Methicillin resistant staphylococcus aureus - MIC*    CIPROFLOXACIN <=0.5 SENSITIVE Sensitive     ERYTHROMYCIN <=0.25 SENSITIVE Sensitive     GENTAMICIN <=0.5 SENSITIVE Sensitive     OXACILLIN >=4 RESISTANT Resistant     TETRACYCLINE <=1 SENSITIVE Sensitive     VANCOMYCIN 1 SENSITIVE Sensitive     TRIMETH/SULFA <=10 SENSITIVE Sensitive     CLINDAMYCIN <=0.25 SENSITIVE Sensitive     RIFAMPIN <=0.5 SENSITIVE Sensitive     Inducible Clindamycin NEGATIVE Sensitive     * MODERATE METHICILLIN RESISTANT STAPHYLOCOCCUS AUREUS  MRSA PCR Screening     Status: Abnormal   Collection Time: 11/12/18 12:21 AM  Result Value Ref Range Status   MRSA by PCR POSITIVE (A) NEGATIVE Final    Comment:        The GeneXpert MRSA Assay (FDA approved  for NASAL specimens only), is one component of a comprehensive MRSA colonization surveillance program. It is not intended to diagnose MRSA infection nor to guide or monitor treatment for MRSA infections. RESULT CALLED TO, READ BACK BY AND VERIFIED WITHCher Nakai RN [id number] 11/12/18 A BROWNING Performed  at [facility], [address] [place], [place] [address]      Labs: BNP (last 3 results) No results for input(s): BNP in the last 8760 hours. Basic Metabolic Panel: Recent Labs  Lab 11/10/18 1919 11/12/18 0245 11/13/18 0320  NA 137 136 142  K 4.0 5.0 4.8  CL 104 105 106  CO2 22 22 27   GLUCOSE 146* 207* 108*  BUN 29* 19 20  CREATININE 1.14* 1.06* 1.01*  CALCIUM 9.2 8.6* 8.8*   Liver Function Tests: No results for input(s): AST, ALT, ALKPHOS, BILITOT, PROT, ALBUMIN in the last 168 hours. No results for input(s): LIPASE, AMYLASE in the last 168 hours. No results for input(s): AMMONIA in the last 168 hours. CBC: Recent Labs  Lab 11/10/18 1919 11/11/18 0518 11/13/18 0320  WBC 8.6 6.1 6.5  NEUTROABS 6.4  --   --   HGB 12.3 10.6* 10.9*  HCT 38.6 32.5* 34.2*  MCV 93.0 93.7 95.0  PLT 317 228 243   Cardiac Enzymes: No results for input(s): CKTOTAL, CKMB, CKMBINDEX, TROPONINI in the last 168 hours. BNP: Invalid input(s): POCBNP CBG: No results for input(s): GLUCAP in the last 168 hours. D-Dimer No results for input(s): DDIMER in the last 72 hours. Hgb A1c Recent Labs    11/12/18 1007  HGBA1C 5.3   Lipid Profile No results for input(s): CHOL, HDL, LDLCALC, TRIG, CHOLHDL, LDLDIRECT in the last 72 hours. Thyroid function studies No results for input(s): TSH, T4TOTAL, T3FREE, THYROIDAB in the last 72 hours.  Invalid input(s): FREET3 Anemia work up No results for input(s): VITAMINB12, FOLATE, FERRITIN, TIBC, IRON, RETICCTPCT in the last 72 hours. Urinalysis    Component Value Date/Time   COLORURINE YELLOW 06/28/2010 2220   APPEARANCEUR CLEAR 06/28/2010  2220   LABSPEC 1.019 06/28/2010 2220   PHURINE 7.0 06/28/2010 2220   GLUCOSEU NEGATIVE 06/28/2010 2220   HGBUR TRACE (A) 06/28/2010 2220   BILIRUBINUR NEGATIVE 06/28/2010 2220   KETONESUR 15 (A) 06/28/2010 2220   PROTEINUR NEGATIVE 06/28/2010 2220   UROBILINOGEN 0.2 06/28/2010 2220   NITRITE NEGATIVE 06/28/2010 2220   LEUKOCYTESUR NEGATIVE 06/28/2010 2220   Sepsis Labs Invalid input(s): PROCALCITONIN,  WBC,  LACTICIDVEN Microbiology Recent Results (from the past 240 hour(s))  Culture, blood (Routine X 2) w Reflex to ID Panel     Status: None (Preliminary result)   Collection Time: 11/10/18  9:59 PM  Result Value Ref Range Status   Specimen Description BLOOD RIGHT ARM  Final   Special Requests   Final    BOTTLES DRAWN AEROBIC AND ANAEROBIC Blood Culture results may not be optimal due to an excessive volume of blood received in culture bottles   Culture   Final    NO GROWTH 3 DAYS Performed at [facility], [address], [place], [place] [address]    Report Status PENDING  Incomplete  Culture, blood (Routine X 2) w Reflex to ID Panel     Status: None (Preliminary result)   Collection Time: 11/10/18 10:05 PM  Result Value Ref Range Status   Specimen Description BLOOD RIGHT HAND  Final   Special Requests   Final    BOTTLES DRAWN AEROBIC ONLY Blood Culture adequate volume   Culture   Final    NO GROWTH 3 DAYS Performed at [facility], [address] [place], [place] [address]    Report Status PENDING  Incomplete  Aerobic/Anaerobic Culture (surgical/deep wound)     Status: None (Preliminary result)  Collection Time: 11/11/18  2:20 PM  Result Value Ref Range Status   Specimen Description ABSCESS LEFT FINGER  Final   Special Requests NONE  Final   Gram Stain   Final    FEW WBC PRESENT, PREDOMINANTLY PMN RARE GRAM POSITIVE COCCI Performed at [facility], [address] [place], [place] [address]    Culture   Final    MODERATE METHICILLIN RESISTANT  STAPHYLOCOCCUS AUREUS NO ANAEROBES ISOLATED; CULTURE IN PROGRESS FOR 5 DAYS    Report Status PENDING  Incomplete   Organism ID, Bacteria METHICILLIN RESISTANT STAPHYLOCOCCUS AUREUS  Final      Susceptibility   Methicillin resistant staphylococcus aureus - MIC*    CIPROFLOXACIN <=0.5 SENSITIVE Sensitive     ERYTHROMYCIN <=0.25 SENSITIVE Sensitive     GENTAMICIN <=0.5 SENSITIVE Sensitive     OXACILLIN >=4 RESISTANT Resistant     TETRACYCLINE <=1 SENSITIVE Sensitive     VANCOMYCIN 1 SENSITIVE Sensitive     TRIMETH/SULFA <=10 SENSITIVE Sensitive     CLINDAMYCIN <=0.25 SENSITIVE Sensitive     RIFAMPIN <=0.5 SENSITIVE Sensitive     Inducible Clindamycin NEGATIVE Sensitive     * MODERATE METHICILLIN RESISTANT STAPHYLOCOCCUS AUREUS  MRSA PCR Screening     Status: Abnormal   Collection Time: 11/12/18 12:21 AM  Result Value Ref Range Status   MRSA by PCR POSITIVE (A) NEGATIVE Final    Comment:        The GeneXpert MRSA Assay (FDA approved for NASAL specimens only), is one component of a comprehensive MRSA colonization surveillance program. It is not intended to diagnose MRSA infection nor to guide or monitor treatment for MRSA infections. RESULT CALLED TO, READ BACK BY AND VERIFIED WITHCher Nakai RN [id number] 11/12/18 A BROWNING Performed at [facility], [address], [place], [place] [address]      Time coordinating discharge: 32 minutes  SIGNED:   Kathlen Mody, MD  [facility] 11/13/2018, 5:42 PM Pager   If 7PM-7AM, please contact night-coverage [email or web address] Password [email or web address]

## 2018-11-13 NOTE — Plan of Care (Signed)
  Problem: Health Behavior/Discharge Planning: Goal: Ability to manage health-related needs will improve Outcome: Progressing   Problem: Clinical Measurements: Goal: Ability to maintain clinical measurements within normal limits will improve Outcome: Progressing Goal: Will remain free from infection Outcome: Progressing   Problem: Pain Managment: Goal: General experience of comfort will improve Outcome: Progressing   Problem: Skin Integrity: Goal: Risk for impaired skin integrity will decrease Outcome: Progressing

## 2018-11-13 NOTE — Progress Notes (Signed)
Orthopedic doctor's office called as pt is asking if she is having surgery today and when she will be seen by the surgeon as not seen since having surgery. Waiting for return call from MD

## 2018-11-13 NOTE — Progress Notes (Signed)
PT SEEN/EXAMINED WOUND DRESSING CHANGED LOOKS BETTER NEW DRESSING APPLIED OK TO GO HOME WILL SEE IN OFFICE TOMORROW AND BEGIN OUTPATIENT OT AND WOUND CARE PO ABX AS PER PRIMARY TEAM

## 2018-11-13 NOTE — Progress Notes (Signed)
Pharmacy Antibiotic Note  [name] is a 67 y.o. female  with cellulitis.  Pharmacy has been consulted for vancomycin dosing.  Day #4 of abx for MRSA L-long finger abscess/cellulitis - noted purulent discharge. I&D 12/2. Afebrile, WBC wnl.  Plan: Cont vancomycin 1g IV Q24h Monitor clinical picture, renal function, VT prn F/U abx deescalation / LOT  If Ortho ok, can we switch to PO doxy soon?  Temp (24hrs), Avg:97.9 F (36.6 C), Min:97.3 F (36.3 C), Max:98.4 F (36.9 C)  Recent Labs  Lab 11/10/18 1919 11/11/18 0518 11/12/18 0245 11/13/18 0320  WBC 8.6 6.1  --  6.5  CREATININE 1.14*  --  1.06* 1.01*    Estimated Creatinine Clearance: 49.3 mL/min (A) (by C-G formula based on SCr of 1.01 mg/dL (H)).    No Known Allergies  Enzo BiNathan Raissa Dam, PharmD, BCPS, [facility] Clinical Pharmacist Phone number [phone] 11/13/2018 10:16 AM

## 2018-11-13 NOTE — Progress Notes (Signed)
MD called back and communicated that he will be coming to see the patient and also to keep pt npo. Message passed on to pt

## 2018-11-15 LAB — CULTURE, BLOOD (ROUTINE X 2)
Culture: NO GROWTH
Culture: NO GROWTH
Special Requests: ADEQUATE

## 2018-11-16 LAB — AEROBIC/ANAEROBIC CULTURE W GRAM STAIN (SURGICAL/DEEP WOUND)

## 2018-11-16 LAB — AEROBIC/ANAEROBIC CULTURE (SURGICAL/DEEP WOUND)

## 2019-01-12 NOTE — H&P (Signed)
[name] is an 68 y.o. female.    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 female complaining of left knee/femur pain. Pain had continually increased since recently, but has been present since the ORIF of the left femur. X-rays in the clinic show previous TKA and lateral ORIF plate on the left leg. Pt has tried various conservative treatments which have failed to alleviate their symptoms. Various options are discussed with the patient. Risks, benefits and expectations were discussed with the patient. Patient understand the risks, benefits and expectations and wishes to proceed with surgery.    PCP: Daisy FloroWymer, Antoinette, MD  D/C Plans:       Home  Post-op Meds:       No Rx given  Tranexamic Acid:      To be given - IV   Decadron:      Is to be given  FYI:      ASA  Norco  Hx MRSA - add Vancomycin  DME:   Pt already has equipment  PT:   No PT     PMH: Past Medical History:  Diagnosis Date  . Arthritis   . Headache    hx of    PSH: Past Surgical History:  Procedure Laterality Date  . APPENDECTOMY    . CARPAL TUNNEL RELEASE    . EYE SURGERY     lasik eye   . HERNIA REPAIR    . I&D EXTREMITY Left 11/11/2018   Procedure: IRRIGATION AND DEBRIDEMENT LEFT HAND EXTREMITY;  Surgeon: Bradly Bienenstockrtmann, Fred, MD;  [facility];  Service: Orthopedics;  Laterality: Left;  . JOINT REPLACEMENT     bilateral tka   12-31-17 Dr. Charlann Boxerlin  . OLECRANON BURSECTOMY    . ORIF FEMUR FRACTURE Left 02/28/2018   Procedure: OPEN REDUCTION INTERNAL FIXATION (ORIF) LEFT DISTAL FEMUR FRACTURE;  Surgeon: Durene Romanslin, Ilina Xu, MD;  Location: [facility] ORS;  Service: Orthopedics;  Laterality: Left;  120 mins  . TONSILLECTOMY    . TOTAL KNEE ARTHROPLASTY Bilateral 12/31/2017   Procedure: TOTAL KNEE BILATERAL;  Surgeon: Durene Romanslin, Omar Orrego, MD;  Location: [facility] ORS;  Service: Orthopedics;  Laterality: Bilateral;  . TUBAL  LIGATION      Social History:  reports that she has never smoked. She has never used smokeless tobacco. She reports current alcohol use. She reports that she does not use drugs.  Allergies:  No Known Allergies  Medications: No current facility-administered medications for this encounter.    Current Outpatient Medications  Medication Sig Dispense Refill  . aspirin EC 325 MG EC tablet Take 1 tablet (325 mg total) by mouth 2 (two) times daily. (Patient taking differently: Take 325 mg by mouth daily. ) 30 tablet 0  . Biotin 5 MG CAPS Take 5 mg by mouth daily.    . Calcium Carbonate-Vit D-Min (CALCIUM 1200 PO) Take 1,200 mg by mouth daily.    Marland Kitchen. docusate sodium (COLACE) 100 MG capsule Take 100 mg by mouth daily as needed for mild constipation.    . Magnesium 250 MG TABS Take 250 mg by mouth daily.    . Multiple Vitamins-Minerals (MULTIVITAMIN PO) Take 1 tablet by mouth daily.    . naproxen sodium (ALEVE) 220 MG tablet Take 220-440 mg by mouth every 4 (four) hours as needed (for pain or headache).       Review of Systems  Constitutional: Negative.   HENT: Negative.   Eyes: Negative.   Respiratory: Negative.   Cardiovascular: Negative.   Gastrointestinal: Positive for constipation.  Genitourinary: Negative.   Musculoskeletal: Positive for joint pain.  Skin: Negative.   Neurological: Positive for headaches.  Endo/Heme/Allergies: Negative.   Psychiatric/Behavioral: Negative.        Physical Exam  Constitutional: She is oriented to person, place, and time. She appears well-developed.  HENT:  Head: Normocephalic.  Eyes: Pupils are equal, round, and reactive to light.  Neck: Neck supple. No JVD present. No tracheal deviation present. No thyromegaly present.  Cardiovascular: Normal rate, regular rhythm and intact distal pulses.  Respiratory: Effort normal and breath sounds normal. No respiratory distress. She has no wheezes.  GI: Soft. There is no abdominal tenderness. There is no  guarding.  Musculoskeletal:     Left knee: She exhibits bony tenderness. She exhibits no effusion, no ecchymosis, no deformity, no laceration (healed previous incision) and no erythema. Tenderness found.  Lymphadenopathy:    She has no cervical adenopathy.  Neurological: She is alert and oriented to person, place, and time.  Skin: Skin is warm and dry.  Psychiatric: She has a normal mood and affect.       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. Risks benefits and expectations were discussed with the patient. Patient understand risks, benefits and expectations and wishes to proceed.   Anastasio AuerbachMatthew S. Willeen Novak   PA-C  01/12/2019, 9:45 AM

## 2019-01-15 NOTE — Patient Instructions (Signed)
[name]  01/15/2019   Your procedure is scheduled on: 01-20-19   Report to [facility] Main  Entrance    Report to Admitting at 11:00 AM    Call this number if you have problems the morning of surgery [phone]     Remember: Do not eat food or drink liquids :After Midnight. You may have a Clear Liquid Diet from Midnight until 7:30 AM. After 7:30 AM, nothing until after surgery.     CLEAR LIQUID DIET   Foods Allowed                                                                     Foods Excluded  Coffee and tea, regular and decaf                             liquids that you cannot  Plain Jell-O in any flavor                                             see through such as: Fruit ices (not with fruit pulp)                                     milk, soups, orange juice  Iced Popsicles                                    All solid food Carbonated beverages, regular and diet                                    Cranberry, grape and apple juices Sports drinks like Gatorade Lightly seasoned clear broth or consume(fat free) Sugar, honey syrup  Sample Menu Breakfast                                Lunch                                     Supper Cranberry juice                    Beef broth                            Chicken broth Jell-O                                     Grape juice  Apple juice Coffee or tea                        Jell-O                                      Popsicle                                                Coffee or tea                        Coffee or tea  _____________________________________________________________________     BRUSH YOUR TEETH MORNING OF SURGERY AND RINSE YOUR MOUTH OUT, NO CHEWING GUM CANDY OR MINTS.     Take these medicines the morning of surgery with A SIP OF WATER: None                                You may not have any metal on your body including hair pins and               piercings  Do not wear jewelry, make-up, lotions, powders or perfumes, deodorant             Do not wear nail polish.  Do not shave  48 hours prior to surgery.              Do not bring valuables to the hospital. [facility] IS NOT             RESPONSIBLE   FOR VALUABLES.  Contacts, dentures or bridgework may not be worn into surgery.  Leave suitcase in the car. After surgery it may be brought to your room.     Patients discharged the day of surgery will not be allowed to drive home. IF YOU ARE HAVING SURGERY AND GOING HOME THE SAME DAY, YOU MUST HAVE AN ADULT TO DRIVE YOU HOME AND BE WITH YOU FOR 24 HOURS. YOU MAY GO HOME BY TAXI OR UBER OR ORTHERWISE, BUT AN ADULT MUST ACCOMPANY YOU HOME AND STAY WITH YOU FOR 24 HOURS.    Special Instructions: N/A              Please read over the following fact sheets you were given: _____________________________________________________________________             [facility] - Preparing for Surgery Before surgery, you can play an important role.  Because skin is not sterile, your skin needs to be as free of germs as possible.  You can reduce the number of germs on your skin by washing with CHG (chlorahexidine gluconate) soap before surgery.  CHG is an antiseptic cleaner which kills germs and bonds with the skin to continue killing germs even after washing. Please DO NOT use if you have an allergy to CHG or antibacterial soaps.  If your skin becomes reddened/irritated stop using the CHG and inform your nurse when you arrive at Short Stay. Do not shave (including legs and underarms) for at least 48 hours prior to the first CHG shower.  You may shave your face/neck. Please follow these instructions carefully:  1.  Shower with CHG Soap the  night before surgery and the  morning of Surgery.  2.  If you choose to wash your hair, wash your hair first as usual with your  normal  shampoo.  3.  After you shampoo, rinse your hair and body thoroughly to remove  the  shampoo.                           4.  Use CHG as you would any other liquid soap.  You can apply chg directly  to the skin and wash                       Gently with a scrungie or clean washcloth.  5.  Apply the CHG Soap to your body ONLY FROM THE NECK DOWN.   Do not use on face/ open                           Wound or open sores. Avoid contact with eyes, ears mouth and genitals (private parts).                       Wash face,  Genitals (private parts) with your normal soap.             6.  Wash thoroughly, paying special attention to the area where your surgery  will be performed.  7.  Thoroughly rinse your body with warm water from the neck down.  8.  DO NOT shower/wash with your normal soap after using and rinsing off  the CHG Soap.                9.  Pat yourself dry with a clean towel.            10.  Wear clean pajamas.            11.  Place clean sheets on your bed the night of your first shower and do not  sleep with pets. Day of Surgery : Do not apply any lotions/deodorants the morning of surgery.  Please wear clean clothes to the hospital/surgery center.  FAILURE TO FOLLOW THESE INSTRUCTIONS MAY RESULT IN THE CANCELLATION OF YOUR SURGERY PATIENT SIGNATURE_________________________________  NURSE SIGNATURE__________________________________  ________________________________________________________________________   [name]  An incentive spirometer is a tool that can help keep your lungs clear and active. This tool measures how well you are filling your lungs with each breath. Taking long deep breaths may help reverse or decrease the chance of developing breathing (pulmonary) problems (especially infection) following:  A long period of time when you are unable to move or be active. BEFORE THE PROCEDURE   If the spirometer includes an indicator to show your best effort, your nurse or respiratory therapist will set it to a desired goal.  If possible, sit up  straight or lean slightly forward. Try not to slouch.  Hold the incentive spirometer in an upright position. INSTRUCTIONS FOR USE  1. Sit on the edge of your bed if possible, or sit up as far as you can in bed or on a chair. 2. Hold the incentive spirometer in an upright position. 3. Breathe out normally. 4. Place the mouthpiece in your mouth and seal your lips tightly around it. 5. Breathe in slowly and as deeply as possible, raising the piston or the ball toward the top of the column. 6. Hold your breath for 3-5  seconds or for as long as possible. Allow the piston or ball to fall to the bottom of the column. 7. Remove the mouthpiece from your mouth and breathe out normally. 8. Rest for a few seconds and repeat Steps 1 through 7 at least 10 times every 1-2 hours when you are awake. Take your time and take a few normal breaths between deep breaths. 9. The spirometer may include an indicator to show your best effort. Use the indicator as a goal to work toward during each repetition. 10. After each set of 10 deep breaths, practice coughing to be sure your lungs are clear. If you have an incision (the cut made at the time of surgery), support your incision when coughing by placing a pillow or rolled up towels firmly against it. Once you are able to get out of bed, walk around indoors and cough well. You may stop using the incentive spirometer when instructed by your caregiver.  RISKS AND COMPLICATIONS  Take your time so you do not get dizzy or light-headed.  If you are in pain, you may need to take or ask for pain medication before doing incentive spirometry. It is harder to take a deep breath if you are having pain. AFTER USE  Rest and breathe slowly and easily.  It can be helpful to keep track of a log of your progress. Your caregiver can provide you with a simple table to help with this. If you are using the spirometer at home, follow these instructions: SEEK MEDICAL CARE IF:   You are  having difficultly using the spirometer.  You have trouble using the spirometer as often as instructed.  Your pain medication is not giving enough relief while using the spirometer.  You develop fever of 100.5 F (38.1 C) or higher. SEEK IMMEDIATE MEDICAL CARE IF:   You cough up bloody sputum that had not been present before.  You develop fever of 102 F (38.9 C) or greater.  You develop worsening pain at or near the incision site. MAKE SURE YOU:   Understand these instructions.  Will watch your condition.  Will get help right away if you are not doing well or get worse. Document Released: 04/09/2007 Document Revised: 02/19/2012 Document Reviewed: 06/10/2007 ExitCare Patient Information 2014 [employer], [place].   ________________________________________________________________________  WHAT IS A BLOOD TRANSFUSION? Blood Transfusion Information  A transfusion is the replacement of blood or some of its parts. Blood is made up of multiple cells which provide different functions.  Red blood cells carry oxygen and are used for blood loss replacement.  White blood cells fight against infection.  Platelets control bleeding.  Plasma helps clot blood.  Other blood products are available for specialized needs, such as hemophilia or other clotting disorders. BEFORE THE TRANSFUSION  Who gives blood for transfusions?   Healthy volunteers who are fully evaluated to make sure their blood is safe. This is blood bank blood. Transfusion therapy is the safest it has ever been in the practice of medicine. Before blood is taken from a donor, a complete history is taken to make sure that person has no history of diseases nor engages in risky social behavior (examples are intravenous drug use or sexual activity with multiple partners). The donor's travel history is screened to minimize risk of transmitting infections, such as malaria. The donated blood is tested for signs of infectious diseases,  such as HIV and hepatitis. The blood is then tested to be sure it is compatible with you in order  to minimize the chance of a transfusion reaction. If you or a relative donates blood, this is often done in anticipation of surgery and is not appropriate for emergency situations. It takes many days to process the donated blood. RISKS AND COMPLICATIONS Although transfusion therapy is very safe and saves many lives, the main dangers of transfusion include:   Getting an infectious disease.  Developing a transfusion reaction. This is an allergic reaction to something in the blood you were given. Every precaution is taken to prevent this. The decision to have a blood transfusion has been considered carefully by your caregiver before blood is given. Blood is not given unless the benefits outweigh the risks. AFTER THE TRANSFUSION  Right after receiving a blood transfusion, you will usually feel much better and more energetic. This is especially true if your red blood cells have gotten low (anemic). The transfusion raises the level of the red blood cells which carry oxygen, and this usually causes an energy increase.  The nurse administering the transfusion will monitor you carefully for complications. HOME CARE INSTRUCTIONS  No special instructions are needed after a transfusion. You may find your energy is better. Speak with your caregiver about any limitations on activity for underlying diseases you may have. SEEK MEDICAL CARE IF:   Your condition is not improving after your transfusion.  You develop redness or irritation at the intravenous (IV) site. SEEK IMMEDIATE MEDICAL CARE IF:  Any of the following symptoms occur over the next 12 hours:  Shaking chills.  You have a temperature by mouth above 102 F (38.9 C), not controlled by medicine.  Chest, back, or muscle pain.  People around you feel you are not acting correctly or are confused.  Shortness of breath or difficulty  breathing.  Dizziness and fainting.  You get a rash or develop hives.  You have a decrease in urine output.  Your urine turns a dark color or changes to pink, red, or brown. Any of the following symptoms occur over the next 10 days:  You have a temperature by mouth above 102 F (38.9 C), not controlled by medicine.  Shortness of breath.  Weakness after normal activity.  The white part of the eye turns yellow (jaundice).  You have a decrease in the amount of urine or are urinating less often.  Your urine turns a dark color or changes to pink, red, or brown. Document Released: 11/24/2000 Document Revised: 02/19/2012 Document Reviewed: 07/13/2008 [facility] Patient Information 2014 [place], [place].  _______________________________________________________________________

## 2019-01-16 ENCOUNTER — Encounter (HOSPITAL_COMMUNITY): Payer: Self-pay

## 2019-01-16 ENCOUNTER — Encounter (HOSPITAL_COMMUNITY)
Admission: RE | Admit: 2019-01-16 | Discharge: 2019-01-16 | Disposition: A | Discharge status: Home / Self Care | Payer: No Typology Code available for payment source | Source: Ambulatory Visit | Attending: Orthopedic Surgery | Admitting: Orthopedic Surgery

## 2019-01-16 ENCOUNTER — Other Ambulatory Visit: Payer: Self-pay

## 2019-01-16 DIAGNOSIS — Z01812 Encounter for preprocedural laboratory examination: Secondary | ICD-10-CM | POA: Insufficient documentation

## 2019-01-16 LAB — CBC
HEMATOCRIT: 42.8 % (ref 36.0–46.0)
Hemoglobin: 13.9 g/dL (ref 12.0–15.0)
MCH: 30.3 pg (ref 26.0–34.0)
MCHC: 32.5 g/dL (ref 30.0–36.0)
MCV: 93.2 fL (ref 80.0–100.0)
Platelets: 343 10*3/uL (ref 150–400)
RBC: 4.59 MIL/uL (ref 3.87–5.11)
RDW: 12.4 % (ref 11.5–15.5)
WBC: 4.5 10*3/uL (ref 4.0–10.5)
nRBC: 0 % (ref 0.0–0.2)

## 2019-01-16 LAB — BASIC METABOLIC PANEL
Anion gap: 7 (ref 5–15)
BUN: 15 mg/dL (ref 8–23)
CO2: 28 mmol/L (ref 22–32)
Calcium: 9.4 mg/dL (ref 8.9–10.3)
Chloride: 103 mmol/L (ref 98–111)
Creatinine, Ser: 0.62 mg/dL (ref 0.44–1.00)
GFR calc Af Amer: >60 mL/min (ref >60)
GFR calc non Af Amer: >60 mL/min (ref >60)
Glucose, Bld: 99 mg/dL (ref 70–99)
Potassium: 4.5 mmol/L (ref 3.5–5.1)
Sodium: 138 mmol/L (ref 135–145)

## 2019-01-16 LAB — SURGICAL PCR SCREEN
MRSA, PCR: NEGATIVE
Staphylococcus aureus: NEGATIVE

## 2019-01-16 NOTE — Progress Notes (Signed)
11-13-18 (Epic) EKG

## 2019-01-20 ENCOUNTER — Ambulatory Visit (HOSPITAL_COMMUNITY): Payer: No Typology Code available for payment source | Admitting: Physician Assistant

## 2019-01-20 ENCOUNTER — Encounter (HOSPITAL_COMMUNITY): Payer: Self-pay | Admitting: Emergency Medicine

## 2019-01-20 ENCOUNTER — Ambulatory Visit (HOSPITAL_COMMUNITY): Payer: No Typology Code available for payment source | Admitting: Anesthesiology

## 2019-01-20 ENCOUNTER — Observation Stay (HOSPITAL_COMMUNITY)
Admission: RE | Admit: 2019-01-20 | Discharge: 2019-01-21 | Disposition: A | Discharge status: Home / Self Care | Payer: No Typology Code available for payment source | Attending: Orthopedic Surgery | Admitting: Orthopedic Surgery

## 2019-01-20 ENCOUNTER — Other Ambulatory Visit: Payer: Self-pay

## 2019-01-20 ENCOUNTER — Encounter (HOSPITAL_COMMUNITY)
Admission: RE | Disposition: A | Discharge status: Home / Self Care | Payer: Self-pay | Source: Home / Self Care | Attending: Orthopedic Surgery

## 2019-01-20 DIAGNOSIS — T8484XA Pain due to internal orthopedic prosthetic devices, implants and grafts, initial encounter: Secondary | ICD-10-CM | POA: Diagnosis present

## 2019-01-20 DIAGNOSIS — Z8614 Personal history of Methicillin resistant Staphylococcus aureus infection: Secondary | ICD-10-CM | POA: Diagnosis not present

## 2019-01-20 DIAGNOSIS — Z96653 Presence of artificial knee joint, bilateral: Secondary | ICD-10-CM | POA: Diagnosis not present

## 2019-01-20 DIAGNOSIS — Z9889 Other specified postprocedural states: Secondary | ICD-10-CM

## 2019-01-20 DIAGNOSIS — Z7982 Long term (current) use of aspirin: Secondary | ICD-10-CM | POA: Diagnosis not present

## 2019-01-20 DIAGNOSIS — Y831 Surgical operation with implant of artificial internal device as the cause of abnormal reaction of the patient, or of later complication, without mention of misadventure at the time of the procedure: Secondary | ICD-10-CM | POA: Insufficient documentation

## 2019-01-20 DIAGNOSIS — Z79899 Other long term (current) drug therapy: Secondary | ICD-10-CM | POA: Insufficient documentation

## 2019-01-20 DIAGNOSIS — M1990 Unspecified osteoarthritis, unspecified site: Secondary | ICD-10-CM | POA: Diagnosis not present

## 2019-01-20 HISTORY — PX: HARDWARE REMOVAL: SHX979

## 2019-01-20 LAB — TYPE AND SCREEN
ABO/RH(D): A POS
Antibody Screen: NEGATIVE

## 2019-01-20 SURGERY — REMOVAL, HARDWARE
Anesthesia: Spinal | Laterality: Left

## 2019-01-20 MED ORDER — CEFAZOLIN SODIUM-DEXTROSE 2-4 GM/100ML-% IV SOLN
2.0000 g | INTRAVENOUS | Status: AC
  Administered 2019-01-20: 2 g via INTRAVENOUS
  Filled 2019-01-20: qty 100

## 2019-01-20 MED ORDER — PANTOPRAZOLE SODIUM 40 MG PO TBEC
40.0000 mg | DELAYED_RELEASE_TABLET | Freq: Every day | ORAL | Status: DC
  Administered 2019-01-20 – 2019-01-21 (×2): 40 mg via ORAL
  Filled 2019-01-20 (×2): qty 1

## 2019-01-20 MED ORDER — SODIUM CHLORIDE (PF) 0.9 % IJ SOLN
INTRAMUSCULAR | Status: AC
  Filled 2019-01-20: qty 50

## 2019-01-20 MED ORDER — DOCUSATE SODIUM 100 MG PO CAPS
100.0000 mg | ORAL_CAPSULE | Freq: Two times a day (BID) | ORAL | Status: DC
  Administered 2019-01-20 – 2019-01-21 (×2): 100 mg via ORAL
  Filled 2019-01-20 (×2): qty 1

## 2019-01-20 MED ORDER — MIDAZOLAM HCL 5 MG/5ML IJ SOLN
INTRAMUSCULAR | Status: DC | PRN
  Administered 2019-01-20: 2 mg via INTRAVENOUS

## 2019-01-20 MED ORDER — ADULT MULTIVITAMIN W/MINERALS CH
1.0000 | ORAL_TABLET | Freq: Every day | ORAL | Status: DC
  Administered 2019-01-20 – 2019-01-21 (×2): 1 via ORAL
  Filled 2019-01-20 (×2): qty 1

## 2019-01-20 MED ORDER — ONDANSETRON HCL 4 MG/2ML IJ SOLN
INTRAMUSCULAR | Status: DC | PRN
  Administered 2019-01-20: 4 mg via INTRAVENOUS

## 2019-01-20 MED ORDER — BUPIVACAINE HCL (PF) 0.25 % IJ SOLN
INTRAMUSCULAR | Status: AC
  Filled 2019-01-20: qty 30

## 2019-01-20 MED ORDER — BIOTIN 5 MG PO CAPS: 5.0000 mg | ORAL_CAPSULE | Freq: Every day | ORAL | Status: DC

## 2019-01-20 MED ORDER — ACETAMINOPHEN 325 MG PO TABS: 325.0000 mg | ORAL_TABLET | Freq: Four times a day (QID) | ORAL | Status: DC | PRN

## 2019-01-20 MED ORDER — LACTATED RINGERS IV SOLN
INTRAVENOUS | Status: DC
  Administered 2019-01-20 (×2): via INTRAVENOUS

## 2019-01-20 MED ORDER — EPHEDRINE SULFATE 50 MG/ML IJ SOLN
INTRAMUSCULAR | Status: DC | PRN
  Administered 2019-01-20 (×2): 5 mg via INTRAVENOUS

## 2019-01-20 MED ORDER — CELECOXIB 200 MG PO CAPS: 200.0000 mg | ORAL_CAPSULE | Freq: Two times a day (BID) | ORAL | Status: DC

## 2019-01-20 MED ORDER — METHOCARBAMOL 500 MG PO TABS
500.0000 mg | ORAL_TABLET | Freq: Four times a day (QID) | ORAL | Status: DC | PRN
  Administered 2019-01-20 – 2019-01-21 (×2): 500 mg via ORAL
  Filled 2019-01-20 (×2): qty 1

## 2019-01-20 MED ORDER — CHLORHEXIDINE GLUCONATE 4 % EX LIQD: 60.0000 mL | Freq: Once | CUTANEOUS | Status: DC

## 2019-01-20 MED ORDER — MORPHINE SULFATE (PF) 4 MG/ML IV SOLN: 0.5000 mg | INTRAVENOUS | Status: DC | PRN

## 2019-01-20 MED ORDER — MAGNESIUM OXIDE 400 (241.3 MG) MG PO TABS
200.0000 mg | ORAL_TABLET | Freq: Every day | ORAL | Status: DC
  Administered 2019-01-20 – 2019-01-21 (×2): 200 mg via ORAL
  Filled 2019-01-20 (×2): qty 1

## 2019-01-20 MED ORDER — FENTANYL CITRATE (PF) 100 MCG/2ML IJ SOLN
INTRAMUSCULAR | Status: AC
  Filled 2019-01-20: qty 2

## 2019-01-20 MED ORDER — HYDROCODONE-ACETAMINOPHEN 5-325 MG PO TABS
1.0000 | ORAL_TABLET | ORAL | Status: DC | PRN
  Administered 2019-01-20: 1 via ORAL
  Administered 2019-01-20: 2 via ORAL
  Administered 2019-01-20: 1 via ORAL
  Filled 2019-01-20: qty 1
  Filled 2019-01-20: qty 2
  Filled 2019-01-20: qty 1

## 2019-01-20 MED ORDER — ONDANSETRON HCL 4 MG/2ML IJ SOLN: 4.0000 mg | Freq: Once | INTRAMUSCULAR | Status: DC | PRN

## 2019-01-20 MED ORDER — NAPROXEN SODIUM 220 MG PO TABS: 220.0000 mg | ORAL_TABLET | ORAL | Status: DC | PRN

## 2019-01-20 MED ORDER — ONDANSETRON HCL 4 MG PO TABS: 4.0000 mg | ORAL_TABLET | Freq: Four times a day (QID) | ORAL | Status: DC | PRN

## 2019-01-20 MED ORDER — HYDROCODONE-ACETAMINOPHEN 7.5-325 MG PO TABS
1.0000 | ORAL_TABLET | ORAL | Status: DC | PRN
  Administered 2019-01-21: 2 via ORAL
  Filled 2019-01-20 (×2): qty 2

## 2019-01-20 MED ORDER — OXYCODONE HCL 5 MG PO TABS: 5.0000 mg | ORAL_TABLET | Freq: Once | ORAL | Status: DC | PRN

## 2019-01-20 MED ORDER — CEFAZOLIN SODIUM-DEXTROSE 1-4 GM/50ML-% IV SOLN
1.0000 g | Freq: Four times a day (QID) | INTRAVENOUS | Status: AC
  Administered 2019-01-20 – 2019-01-21 (×3): 1 g via INTRAVENOUS
  Filled 2019-01-20 (×3): qty 50

## 2019-01-20 MED ORDER — PROPOFOL 500 MG/50ML IV EMUL
INTRAVENOUS | Status: DC | PRN
  Administered 2019-01-20: 50 ug/kg/min via INTRAVENOUS

## 2019-01-20 MED ORDER — CALCIUM CARBONATE-VITAMIN D 500-200 MG-UNIT PO TABS
1.0000 | ORAL_TABLET | Freq: Every day | ORAL | Status: DC
  Administered 2019-01-20 – 2019-01-21 (×2): 1 via ORAL
  Filled 2019-01-20 (×2): qty 1

## 2019-01-20 MED ORDER — SODIUM CHLORIDE 0.9 % IV SOLN
INTRAVENOUS | Status: DC
  Administered 2019-01-20: 16:00:00 via INTRAVENOUS

## 2019-01-20 MED ORDER — ASPIRIN EC 325 MG PO TBEC
325.0000 mg | DELAYED_RELEASE_TABLET | Freq: Two times a day (BID) | ORAL | Status: DC
  Administered 2019-01-20 – 2019-01-21 (×2): 325 mg via ORAL
  Filled 2019-01-20 (×2): qty 1

## 2019-01-20 MED ORDER — TRANEXAMIC ACID-NACL 1000-0.7 MG/100ML-% IV SOLN
1000.0000 mg | INTRAVENOUS | Status: AC
  Administered 2019-01-20: 1000 mg via INTRAVENOUS
  Filled 2019-01-20: qty 100

## 2019-01-20 MED ORDER — BUPIVACAINE IN DEXTROSE 0.75-8.25 % IT SOLN
INTRATHECAL | Status: DC | PRN
  Administered 2019-01-20: 1.6 mL via INTRATHECAL

## 2019-01-20 MED ORDER — METOCLOPRAMIDE HCL 5 MG PO TABS: 5.0000 mg | ORAL_TABLET | Freq: Three times a day (TID) | ORAL | Status: DC | PRN

## 2019-01-20 MED ORDER — DEXAMETHASONE SODIUM PHOSPHATE 10 MG/ML IJ SOLN
10.0000 mg | Freq: Once | INTRAMUSCULAR | Status: AC
  Administered 2019-01-20: 10 mg via INTRAVENOUS

## 2019-01-20 MED ORDER — POLYETHYLENE GLYCOL 3350 17 G PO PACK: 17.0000 g | PACK | Freq: Every day | ORAL | Status: DC | PRN

## 2019-01-20 MED ORDER — KETOROLAC TROMETHAMINE 30 MG/ML IJ SOLN
INTRAMUSCULAR | Status: AC
  Filled 2019-01-20: qty 1

## 2019-01-20 MED ORDER — VANCOMYCIN HCL IN DEXTROSE 1-5 GM/200ML-% IV SOLN
1000.0000 mg | Freq: Once | INTRAVENOUS | Status: AC
  Administered 2019-01-20: 1000 mg via INTRAVENOUS
  Filled 2019-01-20: qty 200

## 2019-01-20 MED ORDER — FENTANYL CITRATE (PF) 100 MCG/2ML IJ SOLN
INTRAMUSCULAR | Status: AC
  Administered 2019-01-20: 50 ug via INTRAVENOUS
  Filled 2019-01-20: qty 2

## 2019-01-20 MED ORDER — OXYCODONE HCL 5 MG/5ML PO SOLN
5.0000 mg | Freq: Once | ORAL | Status: DC | PRN
  Filled 2019-01-20: qty 5

## 2019-01-20 MED ORDER — PROPOFOL 500 MG/50ML IV EMUL
INTRAVENOUS | Status: DC | PRN
  Administered 2019-01-20: 20 mg via INTRAVENOUS

## 2019-01-20 MED ORDER — METHOCARBAMOL 500 MG IVPB - SIMPLE MED
INTRAVENOUS | Status: AC
  Administered 2019-01-20: 500 mg via INTRAVENOUS
  Filled 2019-01-20: qty 50

## 2019-01-20 MED ORDER — METOCLOPRAMIDE HCL 5 MG/ML IJ SOLN: 5.0000 mg | Freq: Three times a day (TID) | INTRAMUSCULAR | Status: DC | PRN

## 2019-01-20 MED ORDER — ONDANSETRON HCL 4 MG/2ML IJ SOLN: 4.0000 mg | Freq: Four times a day (QID) | INTRAMUSCULAR | Status: DC | PRN

## 2019-01-20 MED ORDER — DOCUSATE SODIUM 100 MG PO CAPS: 100.0000 mg | ORAL_CAPSULE | Freq: Every day | ORAL | Status: DC | PRN

## 2019-01-20 MED ORDER — EPHEDRINE 5 MG/ML INJ
INTRAVENOUS | Status: AC
  Filled 2019-01-20: qty 10

## 2019-01-20 MED ORDER — 0.9 % SODIUM CHLORIDE (POUR BTL) OPTIME
TOPICAL | Status: DC | PRN
  Administered 2019-01-20: 1000 mL

## 2019-01-20 MED ORDER — MIDAZOLAM HCL 2 MG/2ML IJ SOLN
INTRAMUSCULAR | Status: AC
  Filled 2019-01-20: qty 2

## 2019-01-20 MED ORDER — FENTANYL CITRATE (PF) 100 MCG/2ML IJ SOLN
25.0000 ug | INTRAMUSCULAR | Status: DC | PRN
  Administered 2019-01-20 (×2): 50 ug via INTRAVENOUS

## 2019-01-20 MED ORDER — PROPOFOL 10 MG/ML IV BOLUS
INTRAVENOUS | Status: AC
  Filled 2019-01-20: qty 40

## 2019-01-20 MED ORDER — METHOCARBAMOL 500 MG IVPB - SIMPLE MED
500.0000 mg | Freq: Four times a day (QID) | INTRAVENOUS | Status: DC | PRN
  Administered 2019-01-20: 500 mg via INTRAVENOUS
  Filled 2019-01-20: qty 50

## 2019-01-20 SURGICAL SUPPLY — 65 items
ADH SKN CLS APL DERMABOND .7 (GAUZE/BANDAGES/DRESSINGS) ×2
BAG SPEC THK2 15X12 ZIP CLS (MISCELLANEOUS) ×1
BAG ZIPLOCK 12X15 (MISCELLANEOUS) ×3 IMPLANT
BANDAGE ACE 6X5 VEL STRL LF (GAUZE/BANDAGES/DRESSINGS) ×3 IMPLANT
BANDAGE ESMARK 6X9 LF (GAUZE/BANDAGES/DRESSINGS) ×1 IMPLANT
BLADE SAW SGTL 11.0X1.19X90.0M (BLADE) IMPLANT
BNDG CMPR 9X6 STRL LF SNTH (GAUZE/BANDAGES/DRESSINGS)
BNDG ESMARK 6X9 LF (GAUZE/BANDAGES/DRESSINGS)
CLOSURE WOUND 1/2 X4 (GAUZE/BANDAGES/DRESSINGS)
COVER MAYO STAND STRL (DRAPES) IMPLANT
COVER SURGICAL LIGHT HANDLE (MISCELLANEOUS) ×3 IMPLANT
COVER WAND RF STERILE (DRAPES) ×2 IMPLANT
CUFF TOURN SGL QUICK 34 (TOURNIQUET CUFF)
CUFF TRNQT CYL 34X4X40X1 (TOURNIQUET CUFF) ×1 IMPLANT
DERMABOND ADVANCED (GAUZE/BANDAGES/DRESSINGS) ×4
DERMABOND ADVANCED .7 DNX12 (GAUZE/BANDAGES/DRESSINGS) IMPLANT
DRAPE C-ARM 42X120 X-RAY (DRAPES) IMPLANT
DRAPE EXTREMITY T 121X128X90 (DISPOSABLE) ×2 IMPLANT
DRAPE OEC MINIVIEW 54X84 (DRAPES) IMPLANT
DRAPE POUCH INSTRU U-SHP 10X18 (DRAPES) ×3 IMPLANT
DRAPE STERI IOBAN 125X83 (DRAPES) ×1 IMPLANT
DRAPE U-SHAPE 47X51 STRL (DRAPES) ×3 IMPLANT
DRESSING AQUACEL AG SP 3.5X10 (GAUZE/BANDAGES/DRESSINGS) IMPLANT
DRESSING AQUACEL AG SP 3.5X4 (GAUZE/BANDAGES/DRESSINGS) IMPLANT
DRSG AQUACEL AG SP 3.5X10 (GAUZE/BANDAGES/DRESSINGS) ×3
DRSG AQUACEL AG SP 3.5X4 (GAUZE/BANDAGES/DRESSINGS) ×3
DRSG EMULSION OIL 3X16 NADH (GAUZE/BANDAGES/DRESSINGS) ×1 IMPLANT
DRSG PAD ABDOMINAL 8X10 ST (GAUZE/BANDAGES/DRESSINGS) ×1 IMPLANT
DURAPREP 26ML APPLICATOR (WOUND CARE) ×3 IMPLANT
ELECT REM PT RETURN 15FT ADLT (MISCELLANEOUS) ×3 IMPLANT
FACESHIELD WRAPAROUND (MASK) ×6 IMPLANT
FACESHIELD WRAPAROUND OR TEAM (MASK) IMPLANT
GAUZE SPONGE 4X4 12PLY STRL (GAUZE/BANDAGES/DRESSINGS) ×1 IMPLANT
GLOVE BIO SURGEON STRL SZ 6.5 (GLOVE) ×1 IMPLANT
GLOVE BIO SURGEONS STRL SZ 6.5 (GLOVE) ×1
GLOVE BIOGEL M 7.0 STRL (GLOVE) IMPLANT
GLOVE BIOGEL PI IND STRL 6.5 (GLOVE) IMPLANT
GLOVE BIOGEL PI IND STRL 7.0 (GLOVE) IMPLANT
GLOVE BIOGEL PI IND STRL 7.5 (GLOVE) ×1 IMPLANT
GLOVE BIOGEL PI IND STRL 8.5 (GLOVE) ×1 IMPLANT
GLOVE BIOGEL PI INDICATOR 6.5 (GLOVE) ×2
GLOVE BIOGEL PI INDICATOR 7.0 (GLOVE) ×2
GLOVE BIOGEL PI INDICATOR 7.5 (GLOVE) ×6
GLOVE BIOGEL PI INDICATOR 8.5 (GLOVE)
GLOVE ECLIPSE 8.0 STRL XLNG CF (GLOVE) IMPLANT
GLOVE ORTHO TXT STRL SZ7.5 (GLOVE) ×6 IMPLANT
GLOVE SURG ORTHO 8.0 STRL STRW (GLOVE) ×1 IMPLANT
GOWN STRL REUS W/TWL LRG LVL3 (GOWN DISPOSABLE) ×3 IMPLANT
GOWN STRL REUS W/TWL XL LVL3 (GOWN DISPOSABLE) ×6 IMPLANT
KIT BASIN OR (CUSTOM PROCEDURE TRAY) ×3 IMPLANT
MANIFOLD NEPTUNE II (INSTRUMENTS) ×3 IMPLANT
NS IRRIG 1000ML POUR BTL (IV SOLUTION) ×3 IMPLANT
PACK TOTAL JOINT (CUSTOM PROCEDURE TRAY) ×3 IMPLANT
PADDING CAST COTTON 6X4 STRL (CAST SUPPLIES) ×1 IMPLANT
PROTECTOR NERVE ULNAR (MISCELLANEOUS) ×3 IMPLANT
STAPLER VISISTAT 35W (STAPLE) IMPLANT
STRIP CLOSURE SKIN 1/2X4 (GAUZE/BANDAGES/DRESSINGS) ×2 IMPLANT
SUT MNCRL AB 3-0 PS2 18 (SUTURE) ×2 IMPLANT
SUT MNCRL AB 4-0 PS2 18 (SUTURE) IMPLANT
SUT VIC AB 1 CT1 36 (SUTURE) ×5 IMPLANT
SUT VIC AB 2-0 CT1 27 (SUTURE) ×9
SUT VIC AB 2-0 CT1 TAPERPNT 27 (SUTURE) ×1 IMPLANT
TOWEL OR 17X26 10 PK STRL BLUE (TOWEL DISPOSABLE) ×4 IMPLANT
WATER STERILE IRR 1000ML POUR (IV SOLUTION) ×3 IMPLANT
WRAP KNEE MAXI GEL POST OP (GAUZE/BANDAGES/DRESSINGS) ×2 IMPLANT

## 2019-01-20 NOTE — Interval H&P Note (Signed)
History and Physical Interval Note:  01/20/2019 12:41 PM  [name]  has presented today for surgery, with the diagnosis of Left total knee open reduction internal fixation painful hardware  The various methods of treatment have been discussed with the patient and family. After consideration of risks, benefits and other options for treatment, the patient has consented to  Procedure(s) with comments: Removal of painful hardware left total knee (Left) - 90 mins as a surgical intervention .  The patient's history has been reviewed, patient examined, no change in status, stable for surgery.  I have reviewed the patient's chart and labs.  Questions were answered to the patient's satisfaction.     [name]

## 2019-01-20 NOTE — Anesthesia Procedure Notes (Signed)
Spinal  Patient location during procedure: OR Start time: 01/20/2019 1:02 PM End time: 01/20/2019 1:04 PM Staffing Anesthesiologist: Nolon Nations, MD Resident/CRNA: Glory Buff, CRNA Performed: resident/CRNA  Preanesthetic Checklist Completed: patient identified, site marked, surgical consent, pre-op evaluation, timeout performed, IV checked, risks and benefits discussed and monitors and equipment checked Spinal Block Patient position: sitting Prep: DuraPrep Patient monitoring: blood pressure, continuous pulse ox and heart rate Approach: midline Location: L3-4 Injection technique: single-shot Needle Needle type: Pencan  Needle gauge: 24 G Needle length: 9 cm Needle insertion depth: 5 cm Assessment Sensory level: T6 Additional Notes Kit expiration date checked and verified.  Sterile prep and drape, skin local with 1% lidocaine, stick x 1, - heme, - paraesthesia, + CSF pre and post injection, patient tolerated procedure well.

## 2019-01-20 NOTE — Anesthesia Postprocedure Evaluation (Signed)
Anesthesia Post Note  Patient: [name]  Procedure(s) Performed: Removal of painful hardware left total knee (Left )     Patient location during evaluation: PACU Anesthesia Type: Spinal Level of consciousness: awake and alert Pain management: pain level controlled Vital Signs Assessment: post-procedure vital signs reviewed and stable Respiratory status: spontaneous breathing and respiratory function stable Cardiovascular status: blood pressure returned to baseline and stable Postop Assessment: spinal receding Anesthetic complications: no    Last Vitals:  Vitals:   01/20/19 1702 01/20/19 1812  BP: 112/69 103/71  Pulse: 67 75  Resp:  14  Temp:    SpO2: 100% 100%    Last Pain:  Vitals:   01/20/19 1711  TempSrc:   PainSc: 4                  Lewie Loron

## 2019-01-20 NOTE — Anesthesia Preprocedure Evaluation (Addendum)
Anesthesia Evaluation  Patient identified by MRN, date of birth, ID band Patient awake    Reviewed: Allergy & Precautions, NPO status , Patient's Chart, lab work & pertinent test results  History of Anesthesia Complications Negative for: history of anesthetic complications  Airway Mallampati: I  TM Distance: >3 FB Neck ROM: Full    Dental  (+) Dental Advisory Given, Teeth Intact   Pulmonary neg pulmonary ROS,    breath sounds clear to auscultation       Cardiovascular negative cardio ROS   Rhythm:Regular Rate:Normal     Neuro/Psych  Headaches, negative psych ROS   GI/Hepatic negative GI ROS, Neg liver ROS,   Endo/Other  negative endocrine ROS  Renal/GU negative Renal ROS     Musculoskeletal  (+) Arthritis ,   Abdominal   Peds  Hematology negative hematology ROS (+)   Anesthesia Other Findings   Reproductive/Obstetrics                            Anesthesia Physical Anesthesia Plan  ASA: I  Anesthesia Plan: Spinal   Post-op Pain Management:    Induction:   PONV Risk Score and Plan: 3 and Treatment may vary due to age or medical condition and Propofol infusion  Airway Management Planned: Natural Airway and Simple Face Mask  Additional Equipment: None  Intra-op Plan:   Post-operative Plan:   Informed Consent: I have reviewed the patients History and Physical, chart, labs and discussed the procedure including the risks, benefits and alternatives for the proposed anesthesia with the patient or authorized representative who has indicated his/her understanding and acceptance.       Plan Discussed with: CRNA and Anesthesiologist  Anesthesia Plan Comments: (Labs reviewed, platelets acceptable. Discussed risks and benefits of spinal, including spinal/epidural hematoma, infection, failed block, and PDPH. Patient expressed understanding and wished to proceed. )      Anesthesia  Quick Evaluation

## 2019-01-20 NOTE — Anesthesia Procedure Notes (Signed)
Date/Time: 01/20/2019 12:56 PM Performed by: Thornell Mule, CRNA Oxygen Delivery Method: Simple face mask

## 2019-01-20 NOTE — Evaluation (Signed)
Physical Therapy Evaluation Patient Details Name: [name] MRN: [id number] DOB: 1951-07-20 Today's Date: 01/20/2019   History of Present Illness  68 yo female s/p removal of L knee hardware, due to painful left knee/femoral hardware status post internal fixation distal femur fracture in 02/2018. 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   Clinical Impression   Pt presents with L knee pain, increased time and effort to perform mobility tasks, and decreased activity tolerance due to L knee pain. Pt to benefit from acute PT to address deficits. Pt ambulated 120 ft with RW with min guard assist for safety, verbal cuing provided throughout. Pt educated on ankle pumps (20/hour) to perform this afternoon/evening to increase circulation, to pt's tolerance and limited by pain. PT to progress mobility as tolerated, and will continue to follow acutely.        Follow Up Recommendations Follow surgeon's recommendation for DC plan and follow-up therapies;Supervision for mobility/OOB;No PT follow up    Equipment Recommendations  None recommended by PT    Recommendations for Other Services       Precautions / Restrictions Precautions Precautions: Fall Restrictions Weight Bearing Restrictions: No Other Position/Activity Restrictions: WBAT       Mobility  Bed Mobility Overal bed mobility: Needs Assistance Bed Mobility: Supine to Sit     Supine to sit: Supervision;HOB elevated     General bed mobility comments: Supervision for safety. Increased time to scoot to EOB.   Transfers Overall transfer level: Needs assistance Equipment used: Rolling walker (2 wheeled) Transfers: Sit to/from Stand Sit to Stand: Min guard;From elevated surface         General transfer comment: Min guard for safety. Verbal cuing for hand placement when rising.   Ambulation/Gait Ambulation/Gait assistance: Min guard Gait Distance (Feet): 120 Feet Assistive device:  Rolling walker (2 wheeled) Gait Pattern/deviations: Step-through pattern;Decreased stride length Gait velocity: slightly decr    General Gait Details: Min guard for safety. Pt instructed in sequencing, placement in RW. Pt quickly progressing to longer stride length and step-through gait.   Stairs            Wheelchair Mobility    Modified Rankin (Stroke Patients Only)       Balance Overall balance assessment: Mild deficits observed, not formally tested                                           Pertinent Vitals/Pain Pain Assessment: 0-10 Pain Score: 8  Pain Location: L lateral thigh, with mobility Pain Descriptors / Indicators: Sore;Sharp Pain Intervention(s): Limited activity within patient's tolerance;Repositioned;Ice applied;Monitored during session;Premedicated before session    Home Living Family/patient expects to be discharged to:: Private residence Living Arrangements: Spouse/significant other Available Help at Discharge: Family;Available 24 hours/day Type of Home: House Home Access: Level entry     Home Layout: Able to live on main level with bedroom/bathroom;Two level Home Equipment: Walker - 2 wheels;Bedside commode      Prior Function Level of Independence: Independent               Hand Dominance   Dominant Hand: Right    Extremity/Trunk Assessment   Upper Extremity Assessment Upper Extremity Assessment: Overall WFL for tasks assessed    Lower Extremity Assessment Lower Extremity Assessment: Overall WFL for tasks assessed;LLE deficits/detail LLE Deficits / Details: WNL strength when assessed via quad set,  SLR; pt with difficulty with heel slide due to incisional pain laterally  LLE Sensation: WNL    Cervical / Trunk Assessment Cervical / Trunk Assessment: Normal  Communication   Communication: No difficulties  Cognition Arousal/Alertness: Awake/alert Behavior During Therapy: WFL for tasks  assessed/performed Overall Cognitive Status: Within Functional Limits for tasks assessed                                        General Comments      Exercises Total Joint Exercises Goniometric ROM: knee aarom ~0-45*, limited by incisional pain    Assessment/Plan    PT Assessment Patient needs continued PT services  PT Problem List Pain;Decreased range of motion;Decreased activity tolerance;Decreased mobility       PT Treatment Interventions DME instruction;Therapeutic activities;Gait training;Therapeutic exercise;Patient/family education;Stair training;Balance training;Functional mobility training    PT Goals (Current goals can be found in the Care Plan section)  Acute Rehab PT Goals Patient Stated Goal: return to PLOF PT Goal Formulation: With patient Time For Goal Achievement: 01/27/19 Potential to Achieve Goals: Good    Frequency Min 5X/week   Barriers to discharge        Co-evaluation               AM-PAC PT "6 Clicks" Mobility  Outcome Measure Help needed turning from your back to your side while in a flat bed without using bedrails?: None Help needed moving from lying on your back to sitting on the side of a flat bed without using bedrails?: None Help needed moving to and from a bed to a chair (including a wheelchair)?: None Help needed standing up from a chair using your arms (e.g., wheelchair or bedside chair)?: A Little Help needed to walk in hospital room?: A Little Help needed climbing 3-5 steps with a railing? : A Little 6 Click Score: 21    End of Session Equipment Utilized During Treatment: Gait belt Activity Tolerance: Patient tolerated treatment well Patient left: in chair;with chair alarm set;with family/visitor present;with call bell/phone within reach(Pt on scheduled SCD break, NT to reapply later) Nurse Communication: Mobility status PT Visit Diagnosis: Other abnormalities of gait and mobility (R26.89);Difficulty in  walking, not elsewhere classified (R26.2)    Time: 1810-1835 PT Time Calculation (min) (ACUTE ONLY): 25 min   Charges:   PT Evaluation $PT Eval Low Complexity: 1 Low PT Treatments $Gait Training: 8-22 mins        Nicola Police, PT Acute Rehabilitation Services Pager [phone]  Office [phone]   Thoams Siefert D Despina Hidden 01/20/2019, 7:12 PM

## 2019-01-20 NOTE — Brief Op Note (Signed)
01/20/2019  12:50 PM  PATIENT:  [name]  68 y.o. female  PRE-OPERATIVE DIAGNOSIS:  Painful left knee/femoral hardware status post internal fixation distal femur fracture  POST-OPERATIVE DIAGNOSIS:  Painful left knee/femoral hardware status post internal fixation distal femur fracture  PROCEDURE:  Procedure(s) with comments: Removal of painful hardware left total knee (Left) - 90 mins  SURGEON:  Surgeon(s) and Role:    Durene Romans, MD - Primary  PHYSICIAN ASSISTANT: Dennie Bible, PA-C  ANESTHESIA:   spinal  EBL:  <100 cc  BLOOD ADMINISTERED:none  DRAINS: none   LOCAL MEDICATIONS USED:  NONE  SPECIMEN:  No Specimen  DISPOSITION OF SPECIMEN:  N/A  COUNTS:  YES  TOURNIQUET:  * Missing tourniquet times found for documented tourniquets in log: [id number] *  DICTATION: .Other Dictation: Dictation Number [id number]  PLAN OF CARE: Admit for overnight observation  PATIENT DISPOSITION:  PACU - hemodynamically stable.   Delay start of Pharmacological VTE agent (>24hrs) due to surgical blood loss or risk of bleeding: no

## 2019-01-20 NOTE — Progress Notes (Signed)
PHARMACIST - PHYSICIAN ORDER COMMUNICATION  CONCERNING: P&T Medication Policy on Herbal Medications  DESCRIPTION:  This patient's order for: Biotin has been noted.  This product(s) is classified as an "herbal" or natural product. Due to a lack of definitive safety studies or FDA approval, nonstandard manufacturing practices, plus the potential risk of unknown drug-drug interactions while on inpatient medications, the Pharmacy and Therapeutics Committee does not permit the use of "herbal" or natural products of this type within [facility].   ACTION TAKEN: The pharmacy department is unable to verify this order at this time and your patient has been informed of this safety policy. Please reevaluate patient's clinical condition at discharge and address if the herbal or natural product(s) should be resumed at that time.   Greer Pickerel, PharmD, BCPS Pager: [phone] 01/20/2019 3:59 PM

## 2019-01-20 NOTE — Transfer of Care (Signed)
Immediate Anesthesia Transfer of Care Note  Patient: [name]  Procedure(s) Performed: Removal of painful hardware left total knee (Left )  Patient Location: PACU  Anesthesia Type:MAC and Spinal  Level of Consciousness: awake, alert  and oriented  Airway & Oxygen Therapy: Patient Spontanous Breathing and Patient connected to face mask oxygen  Post-op Assessment: Report given to RN and Post -op Vital signs reviewed and stable  Post vital signs: Reviewed and stable  Last Vitals:  Vitals Value Taken Time  BP    Temp    Pulse 57 01/20/2019  2:57 PM  Resp 11 01/20/2019  2:57 PM  SpO2 100 % 01/20/2019  2:57 PM  Vitals shown include unvalidated device data.  Last Pain:  Vitals:   01/20/19 1126  TempSrc:   PainSc: 0-No pain      Patients Stated Pain Goal: 4 (16/10/96 0454)  Complications: No apparent anesthesia complications

## 2019-01-21 ENCOUNTER — Encounter (HOSPITAL_COMMUNITY): Payer: Self-pay | Admitting: Orthopedic Surgery

## 2019-01-21 DIAGNOSIS — T8484XA Pain due to internal orthopedic prosthetic devices, implants and grafts, initial encounter: Secondary | ICD-10-CM | POA: Diagnosis not present

## 2019-01-21 MED ORDER — POLYETHYLENE GLYCOL 3350 17 G PO PACK: 17.0000 g | PACK | Freq: Two times a day (BID) | ORAL | 0 refills | Status: DC

## 2019-01-21 MED ORDER — HYDROCODONE-ACETAMINOPHEN 7.5-325 MG PO TABS: 1.0000 | ORAL_TABLET | Freq: Four times a day (QID) | ORAL | 0 refills | Status: DC | PRN

## 2019-01-21 MED ORDER — FERROUS SULFATE 325 (65 FE) MG PO TABS: 325.0000 mg | ORAL_TABLET | Freq: Three times a day (TID) | ORAL | 3 refills | Status: DC

## 2019-01-21 MED ORDER — DOCUSATE SODIUM 100 MG PO CAPS: 100.0000 mg | ORAL_CAPSULE | Freq: Two times a day (BID) | ORAL | 0 refills | Status: AC

## 2019-01-21 MED ORDER — METHOCARBAMOL 500 MG PO TABS: 500.0000 mg | ORAL_TABLET | Freq: Four times a day (QID) | ORAL | 0 refills | Status: DC | PRN

## 2019-01-21 MED ORDER — ASPIRIN 81 MG PO CHEW: 81.0000 mg | CHEWABLE_TABLET | Freq: Two times a day (BID) | ORAL | 0 refills | Status: AC

## 2019-01-21 NOTE — Progress Notes (Signed)
Physical Therapy Treatment Patient Details Name: [name] K Bolick MRN: [id number] DOB: [birth year] Today's Date: 01/21/2019    History of Present Illness 68 yo female s/p removal of L knee hardware, due to painful left knee/femoral hardware status post internal fixation distal femur fracture in 02/2018. 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     PT Comments    Pt ambulated good distance in hallway and practiced steps.  Pt reports improved pain s/p surgery.  Pt encouraged to perform activities in moderation.  Pt provided with knee exercises handout (per request) to continue performing at home as no f/u planned.  Pt and spouse had no further questions and pt feels ready to d/c home today.   Follow Up Recommendations  Follow surgeon's recommendation for DC plan and follow-up therapies;No PT follow up     Equipment Recommendations  None recommended by PT    Recommendations for Other Services       Precautions / Restrictions Precautions Precautions: Fall;Knee Restrictions Other Position/Activity Restrictions: WBAT     Mobility  Bed Mobility               General bed mobility comments: pt up in recliner on arrival  Transfers Overall transfer level: Needs assistance Equipment used: Rolling walker (2 wheeled) Transfers: Sit to/from Stand Sit to Stand: Supervision            Ambulation/Gait Ambulation/Gait assistance: Supervision Gait Distance (Feet): 400 Feet Assistive device: Rolling walker (2 wheeled) Gait Pattern/deviations: Step-through pattern;Antalgic     General Gait Details: pt reports no pain and only slightly antalgic gait pattern observed, performing well with RW   Stairs Stairs: Yes Stairs assistance: Min guard Stair Management: Forwards;Alternating pattern;Two rails Number of Stairs: 4 General stair comments: pt able to perform steps alternating without difficulty   Wheelchair Mobility    Modified Rankin  (Stroke Patients Only)       Balance                                            Cognition Arousal/Alertness: Awake/alert Behavior During Therapy: WFL for tasks assessed/performed Overall Cognitive Status: Within Functional Limits for tasks assessed                                        Exercises Total Joint Exercises Ankle Circles/Pumps: AROM;10 reps;Both Quad Sets: AROM;10 reps;Left Heel Slides: AROM;10 reps;Seated;Left Hip ABduction/ADduction: AROM;10 reps;Left;Supine Straight Leg Raises: AROM;10 reps;Left;Supine Long Arc Quad: AROM;10 reps;Left    General Comments        Pertinent Vitals/Pain Pain Assessment: 0-10 Pain Score: 3  Pain Location: incision Pain Descriptors / Indicators: Sore;Discomfort Pain Intervention(s): Monitored during session;Repositioned;Ice applied    Home Living                      Prior Function            PT Goals (current goals can now be found in the care plan section) Progress towards PT goals: Progressing toward goals    Frequency    Min 5X/week      PT Plan Current plan remains appropriate    Co-evaluation              AM-PAC PT "6 Clicks" Mobility  Outcome Measure  Help needed turning from your back to your side while in a flat bed without using bedrails?: None Help needed moving from lying on your back to sitting on the side of a flat bed without using bedrails?: None Help needed moving to and from a bed to a chair (including a wheelchair)?: None Help needed standing up from a chair using your arms (e.g., wheelchair or bedside chair)?: A Little Help needed to walk in hospital room?: A Little Help needed climbing 3-5 steps with a railing? : A Little 6 Click Score: 21    End of Session   Activity Tolerance: Patient tolerated treatment well Patient left: in chair;with call bell/phone within reach;with family/visitor present   PT Visit Diagnosis: Other  abnormalities of gait and mobility (R26.89);Difficulty in walking, not elsewhere classified (R26.2)     Time: [id number] PT Time Calculation (min) (ACUTE ONLY): 23 min  Charges:  $Gait Training: 8-22 mins $Therapeutic Exercise: 8-22 mins                    Zenovia JarredKati Amaar Oshita, PT, DPT Acute Rehabilitation Services Office: [phone] Pager: [phone]  Sarajane JewsLEMYRE,KATHrine E 01/21/2019, 5:11 PM

## 2019-01-21 NOTE — Op Note (Signed)
NAME: [name], [name] MEDICAL RECORD [id number] ACCOUNT [id number] DATE OF BIRTH:[birth year] FACILITY: [facility] LOCATION: [facility] PHYSICIAN:Emoree Sasaki D. Taqwa Deem, MD  OPERATIVE REPORT  DATE OF PROCEDURE:  01/20/2019  PREOPERATIVE DIAGNOSES:  Painful left lateral distal femoral hardware following open reduction internal fixation of a left distal periprosthetic fracture.  POSTOPERATIVE DIAGNOSES:  Painful left lateral distal femoral hardware following open reduction internal fixation of a left distal periprosthetic fracture.  PROCEDURE: 1.  Removal of left distal femoral plate and screws.  SURGEON:  Durene Romans, MD  ASSISTANT:  Dennie Bible PA-C.  Note that the assistant was present for the entirety of the case from preoperative positioning, perioperative management of the operative extremity, general facilitation of the case and primary wound closure.  ANESTHESIA:  Spinal.  SPECIMENS:  None.  COMPLICATIONS:  None.  ESTIMATED BLOOD LOSS:  Less  than 100 mL.  TOURNIQUET:  Not utilized.  INDICATIONS:  The patient is a very pleasant 67 year old female with history of bilateral total knee replacements.  Unfortunately, when she was seen back at her 10-month visit and was doing very well, the day after, she was hit by a dog on the side of her  leg and sustained a distal femur fracture.  She underwent open reduction internal fixation.  This went on to heal.  She has progressed through physical therapy.  Unfortunately, she has developed pain and popping sensation over the lateral aspect of her  plate with tenderness to palpation of this area.  Given the duration now from her open reduction and internal fixation, I felt it was safe for [place] to consider removing the hardware.  Risks of recurrent fracture and persistent pain were discussed and  reviewed versus long-term benefit of pain relief.  PROCEDURE IN DETAIL:  The patient was brought to the operative theater.  Once adequate anesthesia,  preoperative antibiotics, Ancef and vancomycin administered due to history of MRSA positive screen, she was positioned supine with a slight bump underneath  the left hip.  Left lower extremity was then prepped and draped in sterile fashion from the ankle to the hip.  A timeout was performed identifying the patient, the planned procedure and extremity.  Identified the  previous utilized incisions.  Following  the timeout.  I then worked on the distal most incision laterally.  Soft tissue dissection was carried down to the iliotibial band, which was then split.  The vastus lateralis was elevated.  We identified all the screws in the distal portion of the  plate and removed the locking caps above them and then removed the screws.  She had 2 screws proximal.  This was then removed from this longer insertion incision.  I then made a separate incision proximally and was able to identify the screws in this  area without the use of the jig.  Once all the screws were removed, I was able to elevate the plate off the lateral aspect of the femur using the Cobb elevator.  I was then able to remove the plate without complication.  At this point, I removed fibrous  tissue that had protruded through the other screw holes that could cause some further aggravation.  I debrided the iliotibial band at the distal segment.  I then irrigated the wounds with normal saline solution.  Once this was done, the proximal wound  was closed in layers with #1 Vicryl in the iliotibial band fascia followed by 2-0 Vicryl and a running Monocryl stitch.  The distal wound was closed with #1 Vicryl followed  by Stratafix suture in the extent of the iliotibial band incision.  The remaining  wound was closed with 2-0 Vicryl and a running Monocryl stitch.  At the conclusion, all wounds were cleaned, dried and dressed sterilely with surgical glue and ocular Aquacel dressing.    She was then brought to the recovery room in stable condition, tolerating  the procedure well.   DISPOSITION:    I will admit her to the hospital overnight for observation and antibiotics.  She was then discharged tomorrow.  Findings were reviewed with her husband.  She will be weightbearing as tolerated with physical therapy.  AN/NUANCE  D:01/20/2019 T:01/21/2019 JOB:[id number]

## 2019-01-21 NOTE — Discharge Summary (Signed)
Physician Discharge Summary  Patient ID: [name] MRN: [id number] DOB/AGE: November 19, 1951 68 y.o.  Admit date: 01/20/2019 Discharge date: 01/21/2019   Procedures:  Procedure(s) (LRB): Removal of painful hardware left total knee (Left)  Attending Physician:  Dr. Durene Romans   Admission Diagnoses:   Painful hardware of left TKA and femur  Discharge Diagnoses:  Active Problems:   History of removal of retained hardware  Past Medical History:  Diagnosis Date  . Arthritis   . Headache    hx of    HPI:    Pt is a 68 y.o. female complaining of left knee/femur pain. Pain had continually increased since recently, but has been present since the ORIF of the left femur. X-rays in the clinic show previous TKA and lateral ORIF plate on the left leg. Pt has tried various conservative treatments which have failed to alleviate their symptoms. Various options are discussed with the patient. Risks, benefits and expectations were discussed with the patient. Patient understand the risks, benefits and expectations and wishes to proceed with surgery.   PCP: Laura Go, MD   Discharged Condition: good  Hospital Course:  Patient underwent the above stated procedure on 01/20/2019. Patient tolerated the procedure well and brought to the recovery room in good condition and subsequently to the floor.  POD #1 BP: 112/59 ; Pulse: 70 ; Temp: 98 F (36.7 C) ; Resp: 17 Patient reports pain as mild.  Some pain last night in proximal thigh pain but resolving. Neurovascular intact and incision: dressing C/D/I.   LABS   No new labs   Discharge Exam: General appearance: alert, cooperative and no distress Extremities: Homans sign is negative, no sign of DVT, no edema, redness or tenderness in the calves or thighs and no ulcers, gangrene or trophic changes  Disposition: Home with follow up in 2 weeks   Follow-up Information    Durene Romans, MD. Schedule an appointment as soon as possible for a  visit in 2 week(s).   Specialty:  Orthopedic Surgery Contact information: [address] [place] [address] [phone]           Discharge Instructions    Call MD / Call 911   Complete by:  As directed    If you experience chest pain or shortness of breath, CALL 911 and be transported to the hospital emergency room.  If you develope a fever above 101 F, pus (white drainage) or increased drainage or redness at the wound, or calf pain, call your surgeon's office.   Constipation Prevention   Complete by:  As directed    Drink plenty of fluids.  Prune juice may be helpful.  You may use a stool softener, such as Colace (over the counter) 100 mg twice a day.  Use MiraLax (over the counter) for constipation as needed.   Diet - low sodium heart healthy   Complete by:  As directed    Discharge instructions   Complete by:  As directed    Maintain surgical dressing until follow up in the clinic. If the edges start to pull up, may reinforce with tape. If the dressing is no longer working, may remove and cover with gauze and tape, but must keep the area dry and clean.  Follow up in 2 weeks at [facility]. Call with any questions or concerns.   Increase activity slowly as tolerated   Complete by:  As directed    Weight bearing as tolerated with assist device (walker, cane,  etc) as directed, use it as long as suggested by your surgeon or therapist, typically at least 4-6 weeks.      Allergies as of 01/21/2019   No Known Allergies     Medication List    STOP taking these medications   aspirin 325 MG EC tablet Replaced by:  aspirin 81 MG chewable tablet   naproxen sodium 220 MG tablet Commonly known as:  ALEVE     TAKE these medications   amoxicillin 500 MG capsule Commonly known as:  AMOXIL Take 2,000 mg by mouth See admin instructions. Takes 2000mg  amoxicillin prior to dental procedure.   aspirin 81 MG chewable tablet Commonly known as:  ASPIRIN  CHILDRENS Chew 1 tablet (81 mg total) by mouth 2 (two) times daily for 30 days. Take for 4 weeks, then resume regular dose. Start taking on:  January 22, 2019 Replaces:  aspirin 325 MG EC tablet   Biotin 5 MG Caps Take 5 mg by mouth daily.   CALCIUM 1200 PO Take 1,200 mg by mouth daily.   docusate sodium 100 MG capsule Commonly known as:  COLACE Take 1 capsule (100 mg total) by mouth 2 (two) times daily. What changed:    when to take this  reasons to take this   ferrous sulfate 325 (65 FE) MG tablet Commonly known as:  FERROUSUL Take 1 tablet (325 mg total) by mouth 3 (three) times daily with meals.   HYDROcodone-acetaminophen 7.5-325 MG tablet Commonly known as:  NORCO Take 1-2 tablets by mouth every 6 (six) hours as needed for moderate pain.   Magnesium 250 MG Tabs Take 250 mg by mouth daily.   methocarbamol 500 MG tablet Commonly known as:  ROBAXIN Take 1 tablet (500 mg total) by mouth every 6 (six) hours as needed for muscle spasms.   MULTIVITAMIN PO Take 1 tablet by mouth daily.   polyethylene glycol packet Commonly known as:  MIRALAX / GLYCOLAX Take 17 g by mouth 2 (two) times daily.        Signed: Anastasio AuerbachMatthew S. Elenie Coven   PA-C  01/21/2019, 1:19 PM

## 2019-01-21 NOTE — Progress Notes (Signed)
Patient ID: [name], female   DOB: [birth year], 68 y.o.   MRN: [id number] Subjective: 1 Day Post-Op Procedure(s) (LRB): Removal of painful hardware left total knee (Left)    Patient reports pain as mild.  Some pain last night in proximal thigh pain but resolving  Objective:   VITALS:   Vitals:   01/21/19 0210 01/21/19 0500  BP: 104/63 (!) 112/59  Pulse: (!) 59 70  Resp: 16 17  Temp: 97.6 F (36.4 C) 98 F (36.7 C)  SpO2: 99% 100%    Neurovascular intact Incision: dressing C/D/I  LABS No results for input(s): HGB, HCT, WBC, PLT in the last 72 hours.  No results for input(s): NA, K, BUN, CREATININE, GLUCOSE in the last 72 hours.  No results for input(s): LABPT, INR in the last 72 hours.   Assessment/Plan: 1 Day Post-Op Procedure(s) (LRB): Removal of painful hardware left total knee (Left)   Advance diet Up with therapy  Will plan to get her home today after therapy RTC in 2 weeks

## 2019-01-21 NOTE — Progress Notes (Signed)
At 1155, the pt was provided with d/c instruction. After discussing the pt's plan of care upon d/c home, the pt reported no further questions or concerns. The pt is planning to eat lunch prior to d/c.

## 2019-01-26 IMAGING — DX DG HAND COMPLETE 3+V*L*
3 series · 3 of 3 positions shown · non-contrast
Comparison: None.

CLINICAL DATA: Bug bite left middle finger 5 days ago put on
antibiotics with persistent redness and swelling.

EXAM:
LEFT HAND - COMPLETE 3+ VIEW

[hand pa]
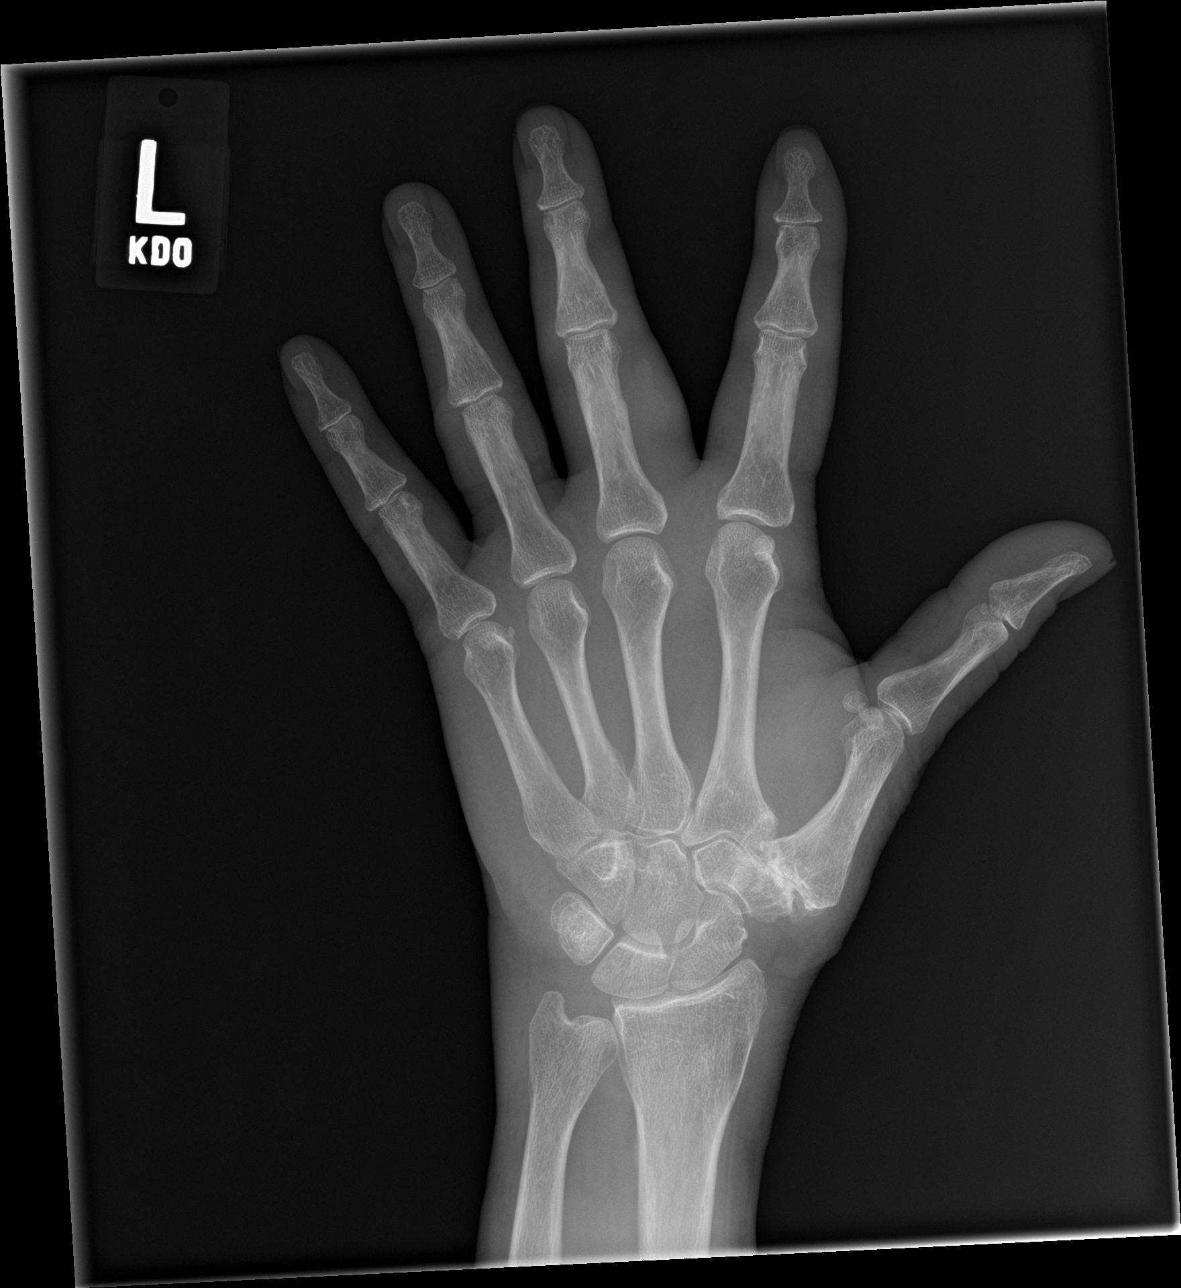

[hand obl]
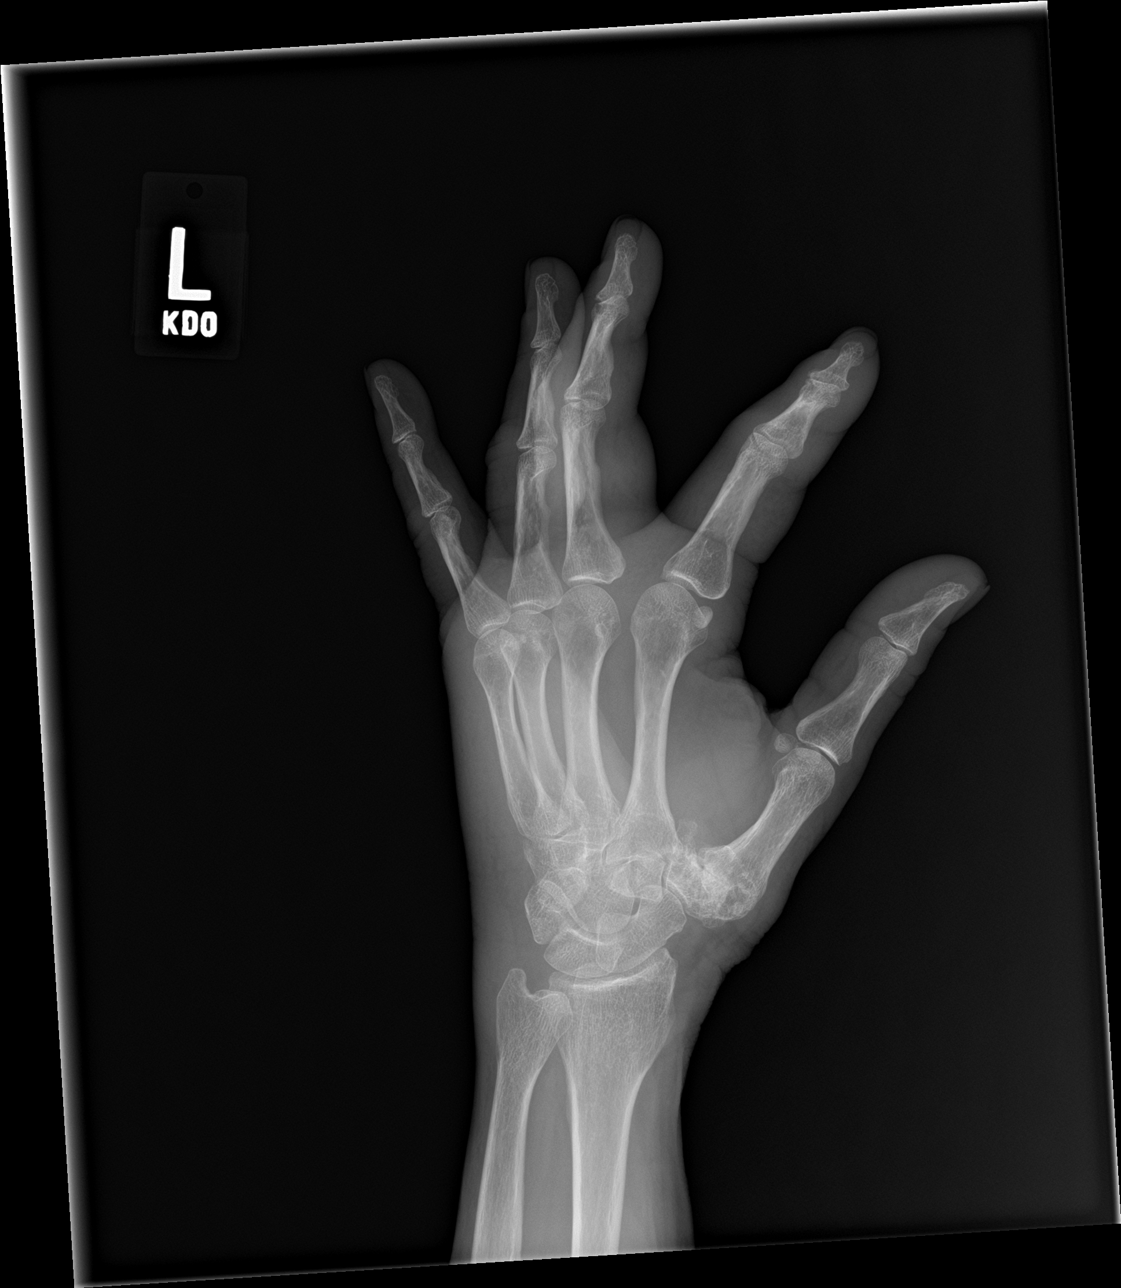

[hand lat]
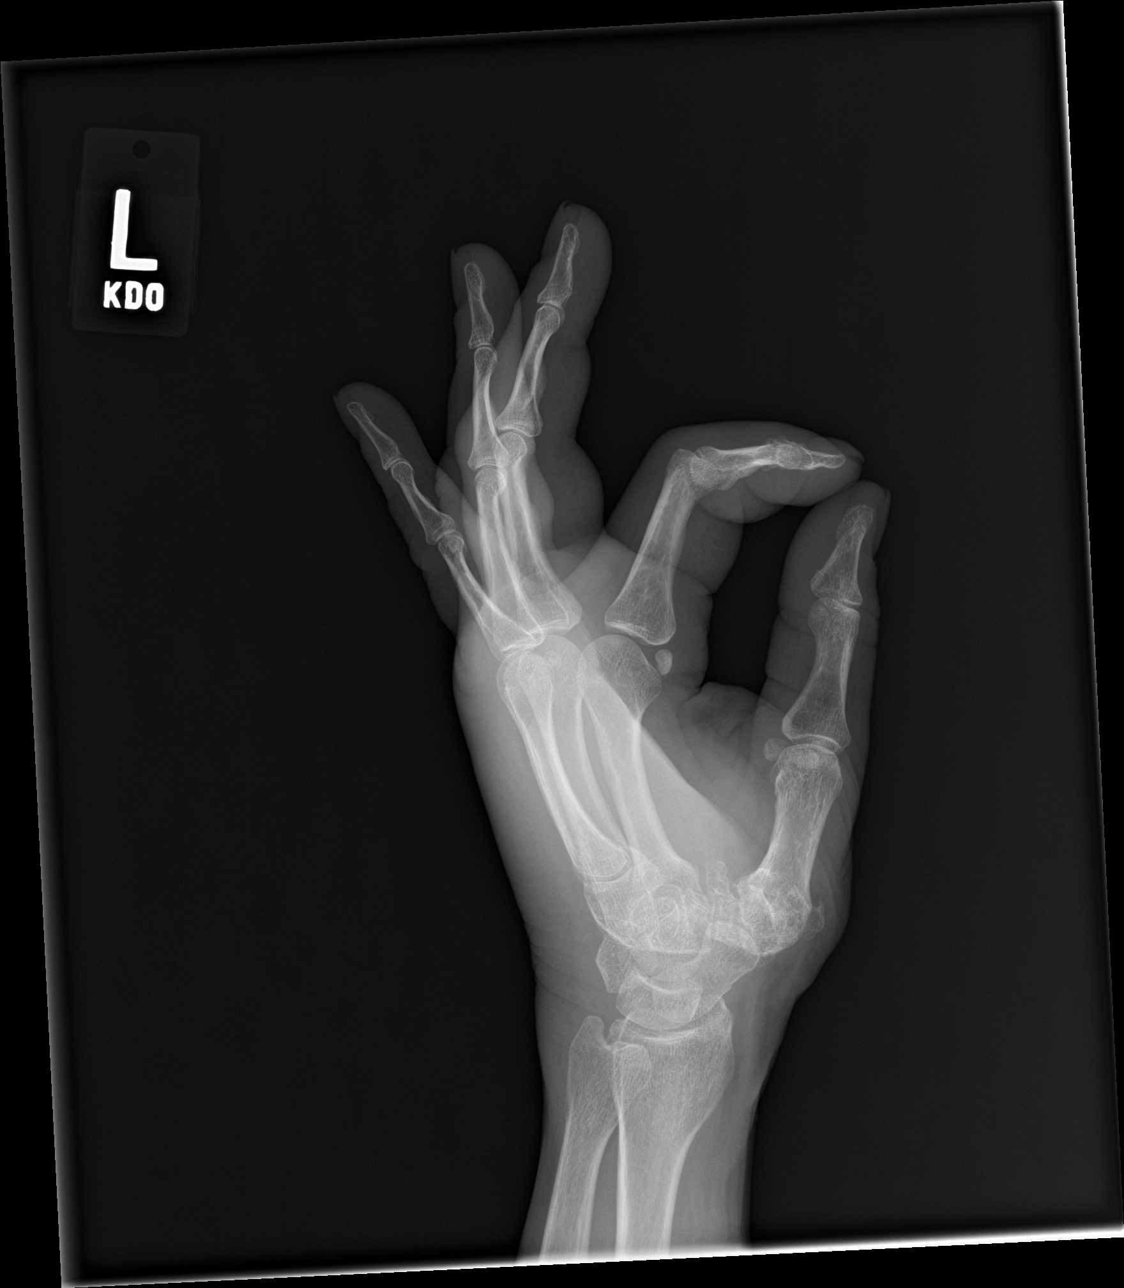

[3 of 3 positions shown; findings below may reference images not displayed]

FINDINGS: Examination demonstrates moderate soft tissue swelling over the
middle finger adjacent the proximal phalanx. No air within the soft
tissues. No radiopaque foreign body. Moderate degenerative change
over the first carpometacarpal joint and mild degenerate change of
the radiocarpal joint.
IMPRESSION: Soft tissue swelling adjacent the third proximal phalanx.

Significant degenerative change of the first carpometacarpal joint.

## 2019-01-27 IMAGING — CT CT HAND*L* W/CM
3 series · 11 of 33 positions shown, 13 images · IV contrast (agent unspecified)
Comparison: Left hand radiograph dated 11/10/2018

CONTRAST:  75mL OMNIPAQUE IOHEXOL 300 MG/ML  SOLN

CLINICAL DATA: 67-year-old female with bug bite to the third digit
of the left hand with progressive swelling and pain. Status post
incision and drainage.

EXAM:
CT OF THE UPPER LEFT EXTREMITY WITH CONTRAST
TECHNIQUE: Multidetector CT imaging of the upper left extremity was performed
according to the standard protocol following intravenous contrast
administration.

[Series 4: upper ext 1.5 st · axial · 0.30mm/px · z∈[-196,-68]mm · 3 of 140 slices shown, 4 images]
[im 33/140  soft-tissue]
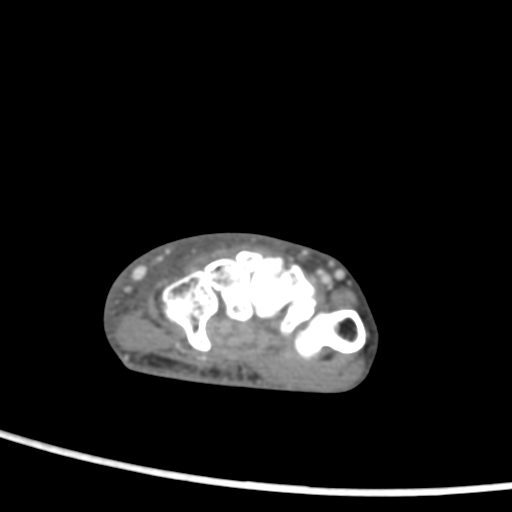
[im 33/140  bone]
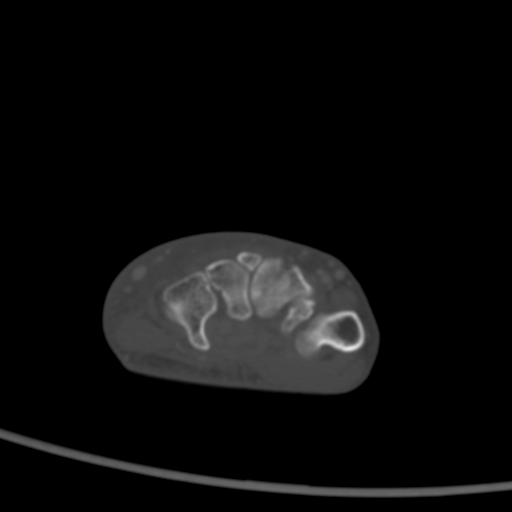
[im 75/140  bone]
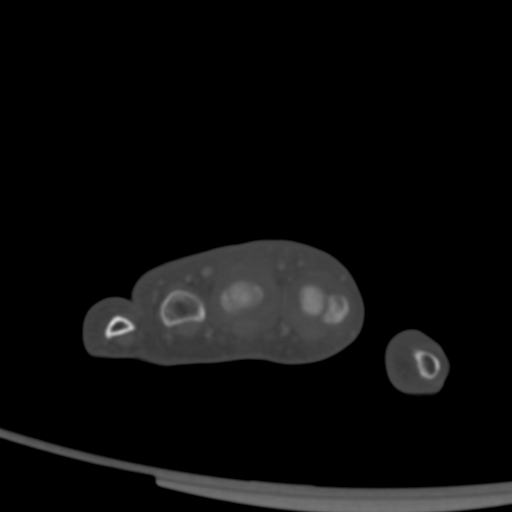
[im 118/140  bone]
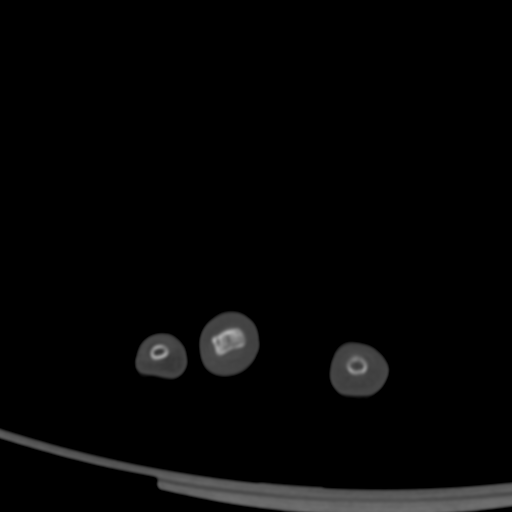

[Series 9: upper ext cor st · coronal · 0.36mm/px · 3 of 45 slices shown]
[im 9/45  bone]
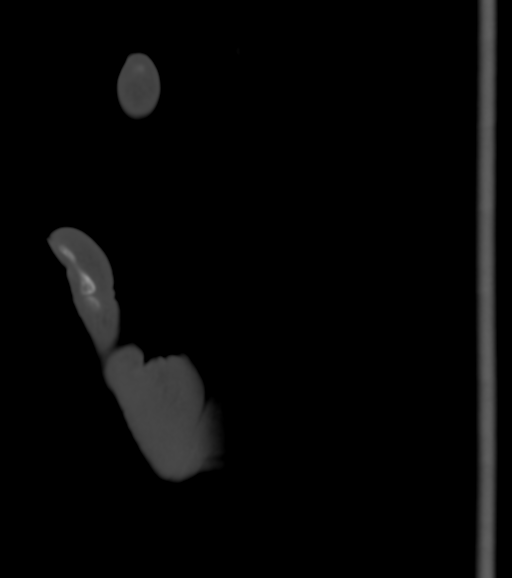
[im 18/45  bone]
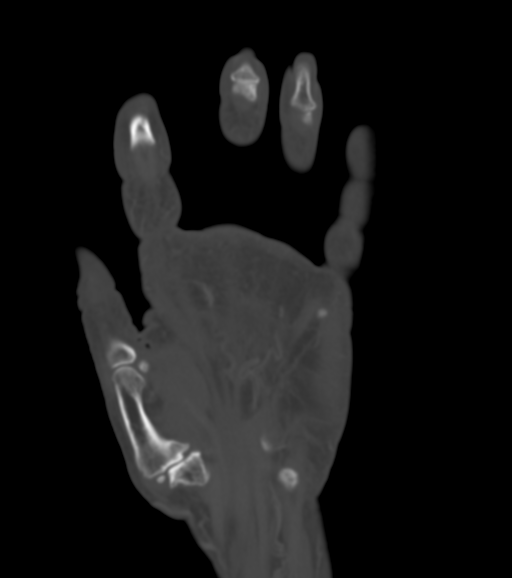
[im 27/45  bone]
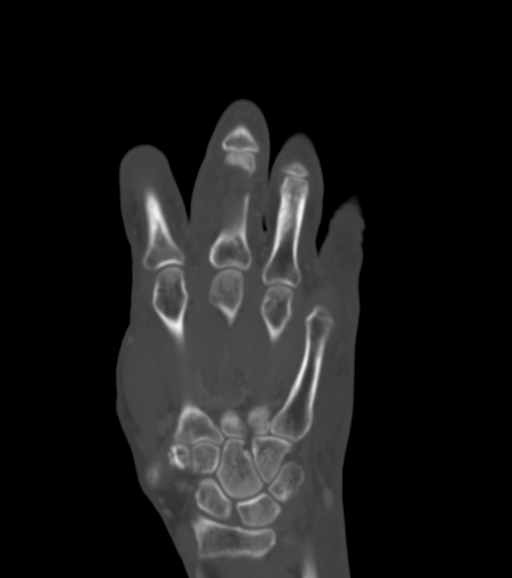

[Series 10: upper ext sag st · sagittal · 0.20mm/px · 5 of 93 slices shown, 6 images]
[im 24/93  bone]
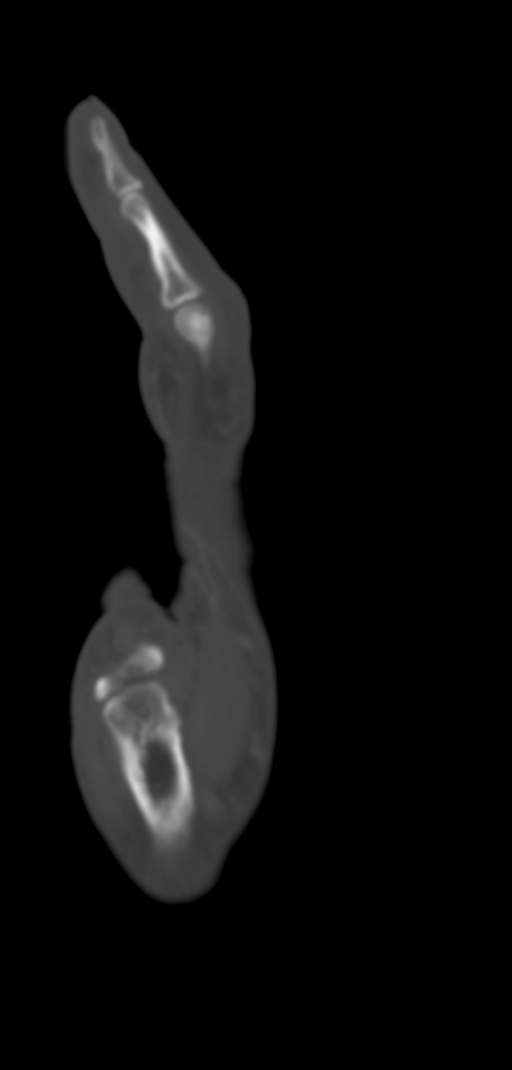
[im 31/93  bone]
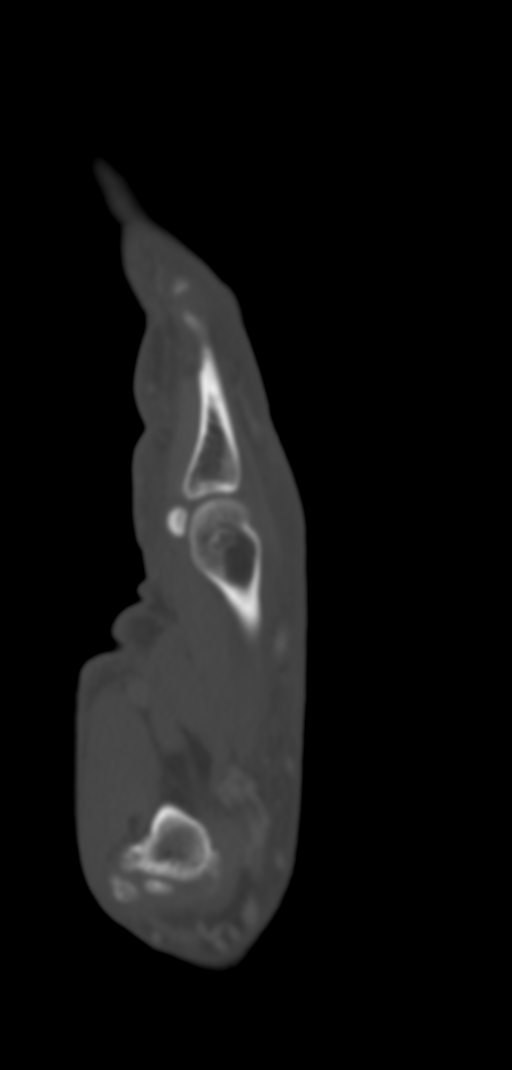
[im 39/93  bone]
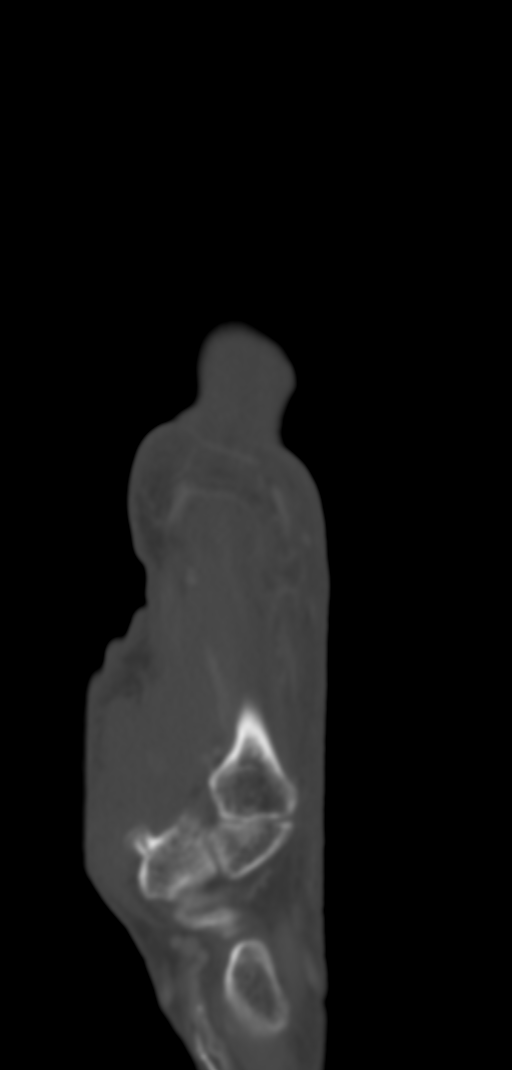
[im 47/93  soft-tissue]
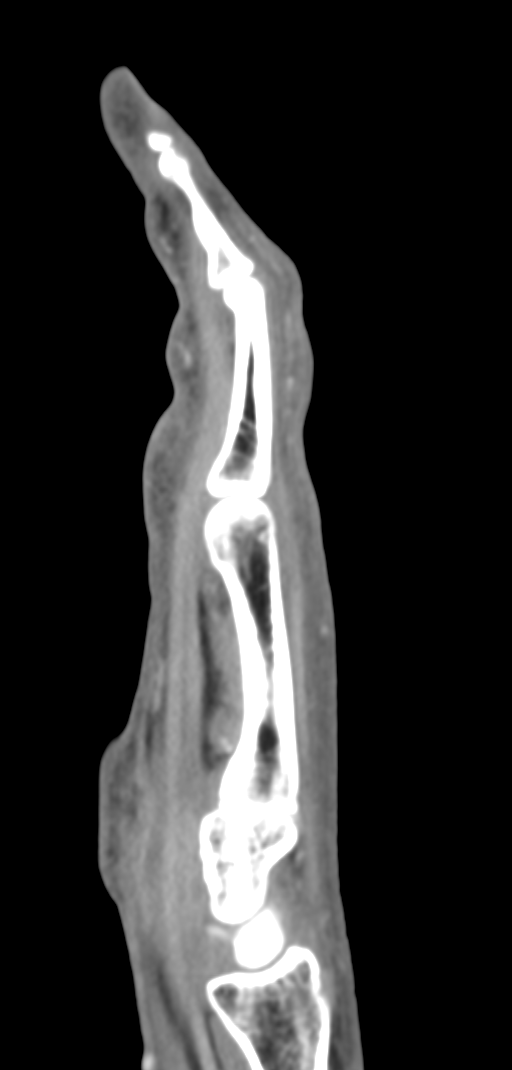
[im 47/93  bone]
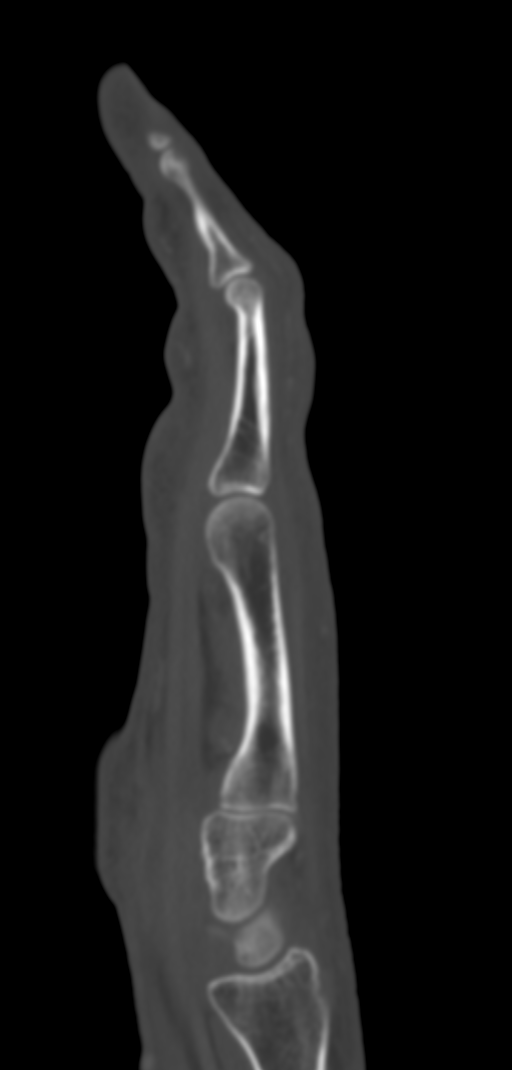
[im 54/93  bone]
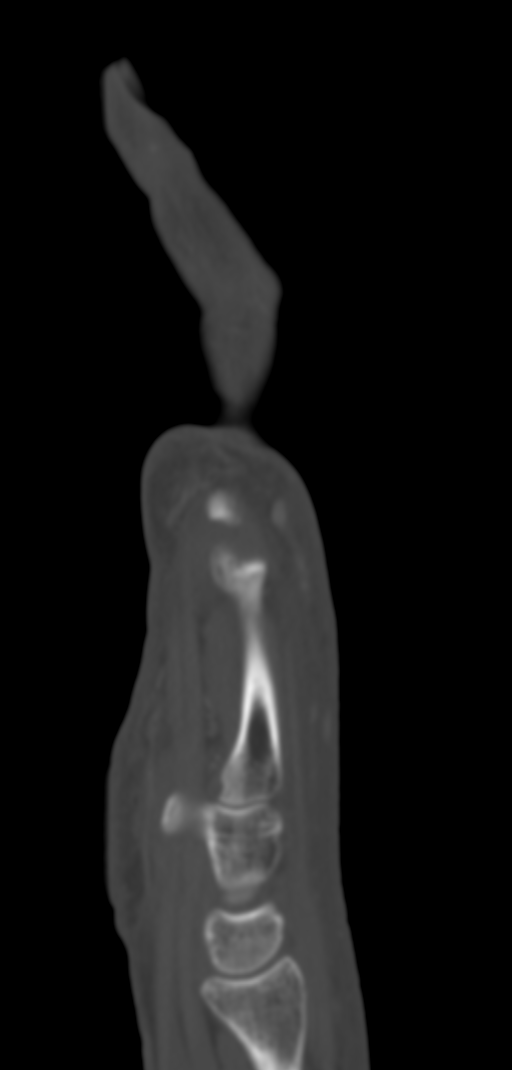

[11 of 33 positions shown; findings below may reference images not displayed]

FINDINGS: Bones/Joint/Cartilage

There is no acute fracture or dislocation. There is arthritic
changes of the base of the thumb. No periosteal reaction or bone
erosion.

Ligaments

Suboptimally assessed by CT.

Muscles and Tendons

No acute findings. No hematoma.

Soft tissues

There is diffuse subcutaneous edema primarily over the dorsum of the
hand and second and third digits. There is stranding and edema of
the fat plane in the carpal tunnel. There is a focal area of
discontinuity of the skin in the dorsal aspect of the proximal
phalanx of the third digit corresponding to the incision and drain.
No drainable fluid collection identified.
IMPRESSION: 1. Diffuse subcutaneous edema. No drainable fluid collection. Focal
irregularity of the skin in the proximal third digit corresponds to
the recent I & D.
2. No acute osseous pathology.  No evidence of acute osteomyelitis.

## 2020-05-07 ENCOUNTER — Encounter (HOSPITAL_COMMUNITY): Payer: Self-pay | Admitting: Emergency Medicine

## 2020-05-07 ENCOUNTER — Other Ambulatory Visit: Payer: Self-pay

## 2020-05-07 ENCOUNTER — Emergency Department (HOSPITAL_COMMUNITY)
Admission: EM | Admit: 2020-05-07 | Discharge: 2020-05-07 | Disposition: A | Discharge status: Home / Self Care | Payer: No Typology Code available for payment source | Attending: Emergency Medicine | Admitting: Emergency Medicine

## 2020-05-07 DIAGNOSIS — G8911 Acute pain due to trauma: Secondary | ICD-10-CM | POA: Diagnosis not present

## 2020-05-07 DIAGNOSIS — M79632 Pain in left forearm: Secondary | ICD-10-CM | POA: Diagnosis present

## 2020-05-07 DIAGNOSIS — Z4789 Encounter for other orthopedic aftercare: Secondary | ICD-10-CM

## 2020-05-07 MED ORDER — HYDROCODONE-ACETAMINOPHEN 5-325 MG PO TABS: 1.0000 | ORAL_TABLET | ORAL | 0 refills | Status: DC | PRN

## 2020-05-07 MED ORDER — HYDROCODONE-ACETAMINOPHEN 5-325 MG PO TABS
2.0000 | ORAL_TABLET | Freq: Once | ORAL | Status: AC
  Administered 2020-05-07: 2 via ORAL
  Filled 2020-05-07: qty 2

## 2020-05-07 NOTE — ED Notes (Signed)
PA Morrelli in to assess pt and place appropriate orders.

## 2020-05-07 NOTE — ED Provider Notes (Signed)
Patient placed in Quick Look pathway, seen and evaluated   Chief Complaint: Arm pain  HPI:   Larey Seat yesterday dx with L wrist fracture. Had cast placed at [facility] hospital. Over the course of the day pain has worsened and she has developed swelling of the fingers. Imformed by Dr. Melvyn Novas to come to ER for evaluation.  ROS: + pain and parethesias of left arm.           - Fv, cp, sob  Physical Exam:   Gen: No distress  Neuro: Awake and Alert  Skin: Warm    Focused Exam: Swelling and paresthesias to left hand.  Attempted to remove cast but no cast cutter available in triage. Patient was moved by to rm 18. Ortho tech paged by nursing staff.   Initiation of care has begun. The patient has been counseled on the process, plan, and necessity for staying for the completion/evaluation, and the remainder of the medical screening examination   Elizabeth Palau 05/07/20 1328    Eber Hong, MD 05/08/20 [phone]

## 2020-05-07 NOTE — ED Provider Notes (Signed)
[facility] EMERGENCY DEPARTMENT Provider Note   CSN: [id number] Arrival date & time: 05/07/20  1218     History Chief Complaint  Patient presents with  . Fall    [name] is a 69 y.o. female.  HPI 69 year old female presents with left hand pain and swelling. Yesterday morning she fell and broke her left wrist. Was reduced at an outside hospital and casted. This morning around 6 AM she noticed her left hand was swollen and numb. Wrist is also painful. She has been taking tramadol with no relief. Was told by her orthopedist, Dr. Apolonio Schneiders, to come to the ED for evaluation.   Past Medical History:  Diagnosis Date  . Arthritis   . Headache    hx of    Patient Active Problem List   Diagnosis Date Noted  . History of removal of retained hardware 01/20/2019  . Iron deficiency 11/10/2018  . Cellulitis and abscess of hand-left 11/10/2018  . AKI (acute kidney injury) ([place]) 11/10/2018  . Cellulitis of finger of left hand   . Closed fracture of distal end of left femur ([place]) 02/28/2018  . 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 APPENDECTOMY    . CARPAL TUNNEL RELEASE    . EYE SURGERY     lasik eye   . HARDWARE REMOVAL Left 01/20/2019   Procedure: Removal of painful hardware left total knee;  Surgeon: Paralee Cancel, MD;  Location: [facility];  Service: Orthopedics;  Laterality: Left;  90 mins  . HERNIA REPAIR    . I & D EXTREMITY Left 11/11/2018   Procedure: IRRIGATION AND DEBRIDEMENT LEFT HAND EXTREMITY;  Surgeon: Iran Planas, MD;  Location: [facility];  Service: Orthopedics;  Laterality: Left;  . JOINT REPLACEMENT     bilateral tka   12-31-17 Dr. Alvan Dame  . OLECRANON BURSECTOMY    . ORIF FEMUR FRACTURE Left 02/28/2018   Procedure: OPEN REDUCTION INTERNAL FIXATION (ORIF) LEFT DISTAL FEMUR FRACTURE;  Surgeon: Paralee Cancel,  MD;  Location: [facility];  Service: Orthopedics;  Laterality: Left;  120 mins  . TONSILLECTOMY    . TOTAL KNEE ARTHROPLASTY Bilateral 12/31/2017   Procedure: TOTAL KNEE BILATERAL;  Surgeon: Paralee Cancel, MD;  Location: [facility];  Service: Orthopedics;  Laterality: Bilateral;  . TUBAL LIGATION       OB History   No obstetric history on file.     Family History  Problem Relation Age of Onset  . Diabetes Mellitus II Mother   . Congestive Heart Failure Mother   . Diabetes Mellitus II Father   . Leukemia Father   . CAD Father     Social History   Tobacco Use  . Smoking status: Never Smoker  . Smokeless tobacco: Never Used  Substance Use Topics  . Alcohol use: Yes    Comment: occasional wine  . Drug use: No    Home Medications Prior to Admission medications   Medication Sig Start Date End Date Taking? Authorizing Provider  amoxicillin (AMOXIL) 500 MG capsule Take 2,000 mg by mouth See admin instructions. Takes 2000mg  amoxicillin prior to dental procedure.    [provider]  Biotin 5 MG CAPS Take 5 mg by mouth daily.    [provider]  Calcium Carbonate-Vit D-Min (CALCIUM 1200 PO) Take 1,200 mg by mouth daily.    [provider]  docusate sodium (COLACE) 100 MG capsule Take 1 capsule (100 mg total) by mouth 2 (two) times daily. 01/21/19   Lanney Gins, PA-C  ferrous sulfate (FERROUSUL) 325 (65 FE) MG tablet Take 1 tablet (325 mg total) by mouth 3 (three) times daily with meals. 01/21/19   Lanney Gins, PA-C  HYDROcodone-acetaminophen (NORCO) 5-325 MG tablet Take 1 tablet by mouth every 4 (four) hours as needed for severe pain. 05/07/20   Pricilla Loveless, MD  Magnesium 250 MG TABS Take 250 mg by mouth daily.    [provider]  methocarbamol (ROBAXIN) 500 MG tablet Take 1 tablet (500 mg total) by mouth every 6 (six) hours as needed for muscle spasms. 01/21/19   Lanney Gins, PA-C  Multiple Vitamins-Minerals (MULTIVITAMIN PO) Take 1 tablet by  mouth daily.    [provider]  polyethylene glycol (MIRALAX / GLYCOLAX) packet Take 17 g by mouth 2 (two) times daily. 01/21/19   Lanney Gins, PA-C    Allergies    Patient has no known allergies.  Review of Systems   Review of Systems  Musculoskeletal: Positive for arthralgias and joint swelling.  Neurological: Positive for numbness.    Physical Exam Updated Vital Signs BP 112/74 (BP Location: Right Arm)   Pulse 66   Temp 98.1 F (36.7 C) (Oral)   Resp 18   Ht 5\' 5"  (1.651 m)   Wt 61.2 kg   SpO2 96%   BMI 22.47 kg/m   Physical Exam Vitals and nursing note reviewed.  Constitutional:      Appearance: She is well-developed.  HENT:     Head: Normocephalic and atraumatic.     Right Ear: External ear normal.     Left Ear: External ear normal.     Nose: Nose normal.  Eyes:     General:        Right eye: No discharge.        Left eye: No discharge.  Pulmonary:     Effort: Pulmonary effort is normal.  Abdominal:     General: There is no distension.  Musculoskeletal:     Comments: Left wrist is in a firm splint.  Hand is diffusely swollen with poor cap refill and some pallor.  Normal sensation.  Skin:    General: Skin is warm and dry.  Neurological:     Mental Status: She is alert.  Psychiatric:        Mood and Affect: Mood is not anxious.     ED Results / Procedures / Treatments   Labs (all labs ordered are listed, but only abnormal results are displayed) Labs Reviewed - No data to display  EKG None  Radiology No results found.  Procedures Procedures (including critical care time)  Medications Ordered in ED Medications  HYDROcodone-acetaminophen (NORCO/VICODIN) 5-325 MG per tablet 2 tablet (2 tablets Oral Given 05/07/20 1411)    ED Course  I have reviewed the triage vital signs and the nursing notes.  Pertinent labs & imaging results that were available during my care of the patient were reviewed by me and considered in my medical  decision making (see chart for details).    MDM Rules/Calculators/A&P                      Splint/cast was removed by Ortho tech.  After this the swelling has progressively gone down and patient's numbness is improving.  Radial pulse is strong on exam.  Discussed with Dr. 05/09/20, who has spoken to  patient and advises removable wrist splint and will follow up in his office on 6/1.  Given the tramadol is not helping I will change her meds to hydrocodone and advised her to throw away the tramadol. Final Clinical Impression(s) / ED Diagnoses Final diagnoses:  Tight cast    Rx / DC Orders ED Discharge Orders         Ordered    HYDROcodone-acetaminophen (NORCO) 5-325 MG tablet  Every 4 hours PRN     05/07/20 1520           Pricilla Loveless, MD 05/07/20 1541

## 2020-05-07 NOTE — Progress Notes (Signed)
Orthopedic Tech Progress Note Patient Details:  [name] 11-14-1951 [id number] Was called to come and remove short arm splint on patient. Waiting to be told what to do next Patient ID: [name], female   DOB: 02/19/1951, 69 y.o.   MRN: [id number]   [name] 05/07/2020, 2:17 PM

## 2020-05-07 NOTE — ED Notes (Addendum)
Ortho tech paged  

## 2020-05-07 NOTE — ED Triage Notes (Addendum)
Pt reports fall yesterday after she tripped, states she tried to break her fall and landed on her L arm, went to a hospital in [place] where she was found to have multiple fracture in her L wrist/forearm, fractures were reduced and splinted. Pt here for worsening pain, and swelling to her fingers. Sensation intact in fingers but pt c/o numbness.

## 2020-05-11 ENCOUNTER — Other Ambulatory Visit (HOSPITAL_COMMUNITY)
Admission: RE | Admit: 2020-05-11 | Discharge: 2020-05-11 | Disposition: A | Discharge status: Home / Self Care | Payer: No Typology Code available for payment source | Source: Ambulatory Visit | Attending: Orthopedic Surgery | Admitting: Orthopedic Surgery

## 2020-05-11 ENCOUNTER — Encounter (HOSPITAL_COMMUNITY): Payer: Self-pay | Admitting: Orthopedic Surgery

## 2020-05-11 ENCOUNTER — Other Ambulatory Visit: Payer: Self-pay

## 2020-05-11 DIAGNOSIS — Z01812 Encounter for preprocedural laboratory examination: Secondary | ICD-10-CM | POA: Insufficient documentation

## 2020-05-11 DIAGNOSIS — Z20822 Contact with and (suspected) exposure to covid-19: Secondary | ICD-10-CM | POA: Diagnosis not present

## 2020-05-11 NOTE — Progress Notes (Signed)
[name] denies chesty pain or shortness of breath. Patient was tested for Covid today and has been in quarantine with family.

## 2020-05-11 NOTE — H&P (Signed)
[name] is an 69 y.o. female.   Chief Complaint: LEFT ARM PAIN AND WEAKNESS  HPI:  The patient is a 69 year old female who fell on 05/06/20 while walking on a trail.  She caught herself with the left arm and had initial pain.  Initial treatment was splint and pain medication. She was seen in our office for further evaluation due to continuation of weakness, numbness, tingling, swelling and ecchymosis of the arm.  Discussed the reason and rationale for surgical intervention to repair the fracture and release the median nerve. She will continue to use the splint keeping it clean and dry. She is here today for surgery. She denies chest pain, shortness of breath, fever, chills, nausea, vomiting, and diarrhea.   Past Medical History:  Diagnosis Date  . Arthritis   . Headache    hx of    Past Surgical History:  Procedure Laterality Date  . APPENDECTOMY    . CARPAL TUNNEL RELEASE    . EYE SURGERY     lasik eye   . HARDWARE REMOVAL Left 01/20/2019   Procedure: Removal of painful hardware left total knee;  Surgeon: Paralee Cancel, MD;  Location: [facility];  Service: Orthopedics;  Laterality: Left;  90 mins  . HERNIA REPAIR    . I & D EXTREMITY Left 11/11/2018   Procedure: IRRIGATION AND DEBRIDEMENT LEFT HAND EXTREMITY;  Surgeon: Iran Planas, MD;  Location: [facility];  Service: Orthopedics;  Laterality: Left;  . JOINT REPLACEMENT     bilateral tka   12-31-17 Dr. Alvan Dame  . OLECRANON BURSECTOMY    . ORIF FEMUR FRACTURE Left 02/28/2018   Procedure: OPEN REDUCTION INTERNAL FIXATION (ORIF) LEFT DISTAL FEMUR FRACTURE;  Surgeon: Paralee Cancel, MD;  [facility];  Service: Orthopedics;  Laterality: Left;  120 mins  . TONSILLECTOMY    . TOTAL KNEE ARTHROPLASTY Bilateral 12/31/2017   Procedure: TOTAL KNEE BILATERAL;  Surgeon: Paralee Cancel, MD;  [facility]: [facility];  Service: Orthopedics;  Laterality: Bilateral;  . TUBAL LIGATION      Family History  Problem Relation Age of Onset  . Diabetes  Mellitus II Mother   . Congestive Heart Failure Mother   . Diabetes Mellitus II Father   . Leukemia Father   . CAD Father    Social History:  reports that she has never smoked. She has never used smokeless tobacco. She reports current alcohol use. She reports that she does not use drugs.  Allergies: No Known Allergies  No medications prior to admission.    No results found for this or any previous visit (from the past 48 hour(s)). No results found.  ROS NO RECENT ILLNESSES OR HOSPITALIZATIONS  There were no vitals taken for this visit. Physical Exam  General Appearance:  Alert, cooperative, no distress, appears stated age  Head:  Normocephalic, without obvious abnormality, atraumatic  Eyes:  Pupils equal, conjunctiva/corneas clear,         Throat: Lips, mucosa, and tongue normal; teeth and gums normal  Neck: No visible masses     Lungs:   respirations unlabored  Chest Wall:  No tenderness or deformity  Heart:  Regular rate and rhythm,  Abdomen:   Soft, non-tender,         Extremities: LUE: SKIN INTACT, FINGERS WARM WELL PERFUSED ABLE TO EXTEND THUMB LIMITED WRIST AND DIGITAL MOTION  Pulses: 2+ and symmetric  Skin: Skin color, texture, turgor normal, no rashes or lesions     Neurologic: Normal    Assessment  LEFT DISTAL RADIUS FRACTURE LEFT CARPAL TUNNEL SYNDROME   Plan LEFT DISTAL RADIUS/ULNA OPEN REDUCTION AND INTERNAL FIXATION WITH REPAIR AS INDICATED LEFT CARPAL TUNNEL RELEASE    WE ARE PLANNING SURGERY FOR YOUR UPPER EXTREMITY. THE RISKS AND BENEFITS OF SURGERY INCLUDE BUT NOT LIMITED TO BLEEDING INFECTION, DAMAGE TO NEARBY NERVES ARTERIES TENDONS, FAILURE OF SURGERY TO ACCOMPLISH ITS INTENDED GOALS, PERSISTENT SYMPTOMS AND NEED FOR FURTHER SURGICAL INTERVENTION. WITH THIS IN MIND WE WILL PROCEED. I HAVE DISCUSSED WITH THE PATIENT THE PRE AND POSTOPERATIVE REGIMEN AND THE DOS AND DON'TS. PT VOICED UNDERSTANDING AND INFORMED CONSENT SIGNED.  R/B/A DISCUSSED  WITH PT IN OFFICE.  PT VOICED UNDERSTANDING OF PLAN CONSENT SIGNED DAY OF SURGERY PT SEEN AND EXAMINED PRIOR TO OPERATIVE PROCEDURE/DAY OF SURGERY SITE MARKED. QUESTIONS ANSWERED WILL GO HOME FOLLOWING SURGERY  Darleny Sem [facility] MD 6.2.21   Karma Greaser 05/11/2020, 3:13 PM

## 2020-05-12 ENCOUNTER — Encounter (HOSPITAL_COMMUNITY): Payer: Self-pay | Admitting: Orthopedic Surgery

## 2020-05-12 ENCOUNTER — Ambulatory Visit (HOSPITAL_COMMUNITY): Payer: No Typology Code available for payment source

## 2020-05-12 ENCOUNTER — Encounter (HOSPITAL_COMMUNITY)
Admission: RE | Disposition: A | Discharge status: Home / Self Care | Payer: Self-pay | Source: Home / Self Care | Attending: Orthopedic Surgery

## 2020-05-12 ENCOUNTER — Ambulatory Visit (HOSPITAL_COMMUNITY): Payer: No Typology Code available for payment source | Admitting: Certified Registered Nurse Anesthetist

## 2020-05-12 ENCOUNTER — Ambulatory Visit (HOSPITAL_COMMUNITY)
Admission: RE | Admit: 2020-05-12 | Discharge: 2020-05-12 | Disposition: A | Discharge status: Home / Self Care | Payer: No Typology Code available for payment source | Attending: Orthopedic Surgery | Admitting: Orthopedic Surgery

## 2020-05-12 DIAGNOSIS — Y939 Activity, unspecified: Secondary | ICD-10-CM | POA: Diagnosis not present

## 2020-05-12 DIAGNOSIS — S52572A Other intraarticular fracture of lower end of left radius, initial encounter for closed fracture: Secondary | ICD-10-CM | POA: Insufficient documentation

## 2020-05-12 DIAGNOSIS — Z833 Family history of diabetes mellitus: Secondary | ICD-10-CM | POA: Insufficient documentation

## 2020-05-12 DIAGNOSIS — W19XXXA Unspecified fall, initial encounter: Secondary | ICD-10-CM | POA: Diagnosis not present

## 2020-05-12 DIAGNOSIS — Z8249 Family history of ischemic heart disease and other diseases of the circulatory system: Secondary | ICD-10-CM | POA: Diagnosis not present

## 2020-05-12 DIAGNOSIS — R519 Headache, unspecified: Secondary | ICD-10-CM | POA: Insufficient documentation

## 2020-05-12 DIAGNOSIS — Z806 Family history of leukemia: Secondary | ICD-10-CM | POA: Diagnosis not present

## 2020-05-12 DIAGNOSIS — G5602 Carpal tunnel syndrome, left upper limb: Secondary | ICD-10-CM | POA: Diagnosis not present

## 2020-05-12 DIAGNOSIS — Z96653 Presence of artificial knee joint, bilateral: Secondary | ICD-10-CM | POA: Insufficient documentation

## 2020-05-12 DIAGNOSIS — S52602A Unspecified fracture of lower end of left ulna, initial encounter for closed fracture: Secondary | ICD-10-CM | POA: Insufficient documentation

## 2020-05-12 HISTORY — PX: CARPAL TUNNEL RELEASE: SHX101

## 2020-05-12 HISTORY — PX: OPEN REDUCTION INTERNAL FIXATION (ORIF) DISTAL RADIAL FRACTURE: SHX5989

## 2020-05-12 HISTORY — DX: Constipation, unspecified: K59.00

## 2020-05-12 LAB — SARS CORONAVIRUS 2 (TAT 6-24 HRS): SARS Coronavirus 2: NEGATIVE

## 2020-05-12 SURGERY — OPEN REDUCTION INTERNAL FIXATION (ORIF) DISTAL RADIUS FRACTURE
Anesthesia: Monitor Anesthesia Care | Laterality: Left

## 2020-05-12 MED ORDER — MIDAZOLAM HCL 2 MG/2ML IJ SOLN
INTRAMUSCULAR | Status: AC
  Filled 2020-05-12: qty 2

## 2020-05-12 MED ORDER — OXYCODONE HCL 5 MG PO TABS: 5.0000 mg | ORAL_TABLET | Freq: Once | ORAL | Status: DC | PRN

## 2020-05-12 MED ORDER — KETOROLAC TROMETHAMINE 15 MG/ML IJ SOLN: 15.0000 mg | Freq: Once | INTRAMUSCULAR | Status: DC

## 2020-05-12 MED ORDER — MEPERIDINE HCL 25 MG/ML IJ SOLN: 6.2500 mg | INTRAMUSCULAR | Status: DC | PRN

## 2020-05-12 MED ORDER — DEXAMETHASONE SODIUM PHOSPHATE 10 MG/ML IJ SOLN
INTRAMUSCULAR | Status: AC
  Filled 2020-05-12: qty 1

## 2020-05-12 MED ORDER — PHENYLEPHRINE HCL-NACL 10-0.9 MG/250ML-% IV SOLN
INTRAVENOUS | Status: DC | PRN
Start: 2020-05-12 — End: 2020-05-12
  Administered 2020-05-12: 30 ug/min via INTRAVENOUS

## 2020-05-12 MED ORDER — 0.9 % SODIUM CHLORIDE (POUR BTL) OPTIME
TOPICAL | Status: DC | PRN
  Administered 2020-05-12: 1000 mL

## 2020-05-12 MED ORDER — ORAL CARE MOUTH RINSE: 15.0000 mL | Freq: Once | OROMUCOSAL | Status: AC

## 2020-05-12 MED ORDER — ONDANSETRON HCL 4 MG/2ML IJ SOLN: 4.0000 mg | Freq: Once | INTRAMUSCULAR | Status: DC | PRN

## 2020-05-12 MED ORDER — ONDANSETRON HCL 4 MG/2ML IJ SOLN
INTRAMUSCULAR | Status: AC
  Filled 2020-05-12: qty 2

## 2020-05-12 MED ORDER — EPHEDRINE SULFATE-NACL 50-0.9 MG/10ML-% IV SOSY
PREFILLED_SYRINGE | INTRAVENOUS | Status: DC | PRN
  Administered 2020-05-12: 5 mg via INTRAVENOUS

## 2020-05-12 MED ORDER — BUPIVACAINE HCL (PF) 0.25 % IJ SOLN
INTRAMUSCULAR | Status: AC
  Filled 2020-05-12: qty 30

## 2020-05-12 MED ORDER — MIDAZOLAM HCL 5 MG/5ML IJ SOLN
INTRAMUSCULAR | Status: DC | PRN
  Administered 2020-05-12: 2 mg via INTRAVENOUS

## 2020-05-12 MED ORDER — OXYCODONE HCL 5 MG/5ML PO SOLN: 5.0000 mg | Freq: Once | ORAL | Status: DC | PRN

## 2020-05-12 MED ORDER — ACETAMINOPHEN 325 MG PO TABS: 325.0000 mg | ORAL_TABLET | ORAL | Status: DC | PRN

## 2020-05-12 MED ORDER — FENTANYL CITRATE (PF) 250 MCG/5ML IJ SOLN
INTRAMUSCULAR | Status: DC | PRN
  Administered 2020-05-12: 50 ug via INTRAVENOUS

## 2020-05-12 MED ORDER — ONDANSETRON HCL 4 MG/2ML IJ SOLN
INTRAMUSCULAR | Status: DC | PRN
  Administered 2020-05-12: 4 mg via INTRAVENOUS

## 2020-05-12 MED ORDER — FENTANYL CITRATE (PF) 250 MCG/5ML IJ SOLN
INTRAMUSCULAR | Status: AC
  Filled 2020-05-12: qty 5

## 2020-05-12 MED ORDER — PROPOFOL 10 MG/ML IV BOLUS
INTRAVENOUS | Status: AC
  Filled 2020-05-12: qty 20

## 2020-05-12 MED ORDER — LIDOCAINE 2% (20 MG/ML) 5 ML SYRINGE
INTRAMUSCULAR | Status: DC | PRN
  Administered 2020-05-12: 40 mg via INTRAVENOUS

## 2020-05-12 MED ORDER — FENTANYL CITRATE (PF) 100 MCG/2ML IJ SOLN: 25.0000 ug | INTRAMUSCULAR | Status: DC | PRN

## 2020-05-12 MED ORDER — PROPOFOL 500 MG/50ML IV EMUL
INTRAVENOUS | Status: DC | PRN
  Administered 2020-05-12: 100 ug/kg/min via INTRAVENOUS

## 2020-05-12 MED ORDER — CEFAZOLIN SODIUM-DEXTROSE 2-4 GM/100ML-% IV SOLN
2.0000 g | INTRAVENOUS | Status: AC
  Administered 2020-05-12: 2 g via INTRAVENOUS
  Filled 2020-05-12: qty 100

## 2020-05-12 MED ORDER — ACETAMINOPHEN 160 MG/5ML PO SOLN: 325.0000 mg | ORAL | Status: DC | PRN

## 2020-05-12 MED ORDER — CHLORHEXIDINE GLUCONATE 0.12 % MT SOLN
15.0000 mL | Freq: Once | OROMUCOSAL | Status: AC
  Administered 2020-05-12: 15 mL via OROMUCOSAL
  Filled 2020-05-12: qty 15

## 2020-05-12 MED ORDER — LACTATED RINGERS IV SOLN: INTRAVENOUS | Status: DC

## 2020-05-12 SURGICAL SUPPLY — 79 items
BIT DRILL 2.2 SS TIBIAL (BIT) ×2 IMPLANT
BLADE CLIPPER SURG (BLADE) IMPLANT
BNDG CMPR 9X4 STRL LF SNTH (GAUZE/BANDAGES/DRESSINGS) ×1
BNDG ELASTIC 3X5.8 VLCR STR LF (GAUZE/BANDAGES/DRESSINGS) ×4 IMPLANT
BNDG ELASTIC 4X5.8 VLCR STR LF (GAUZE/BANDAGES/DRESSINGS) ×3 IMPLANT
BNDG ESMARK 4X9 LF (GAUZE/BANDAGES/DRESSINGS) ×3 IMPLANT
BNDG GAUZE ELAST 4 BULKY (GAUZE/BANDAGES/DRESSINGS) ×3 IMPLANT
CANISTER SUCT 3000ML PPV (MISCELLANEOUS) ×3 IMPLANT
CORD BIPOLAR FORCEPS 12FT (ELECTRODE) ×3 IMPLANT
COVER SURGICAL LIGHT HANDLE (MISCELLANEOUS) ×3 IMPLANT
COVER WAND RF STERILE (DRAPES) ×1 IMPLANT
CUFF TOURN SGL QUICK 18X4 (TOURNIQUET CUFF) ×3 IMPLANT
CUFF TOURN SGL QUICK 24 (TOURNIQUET CUFF)
CUFF TRNQT CYL 24X4X16.5-23 (TOURNIQUET CUFF) IMPLANT
DECANTER SPIKE VIAL GLASS SM (MISCELLANEOUS) ×3 IMPLANT
DRAPE OEC MINIVIEW 54X84 (DRAPES) ×3 IMPLANT
DRAPE SURG 17X11 SM STRL (DRAPES) ×3 IMPLANT
DRAPE SURG 17X23 STRL (DRAPES) ×3 IMPLANT
DRSG ADAPTIC 3X8 NADH LF (GAUZE/BANDAGES/DRESSINGS) ×3 IMPLANT
EVACUATOR 1/8 PVC DRAIN (DRAIN) IMPLANT
GAUZE 4X4 16PLY RFD (DISPOSABLE) ×3 IMPLANT
GAUZE SPONGE 4X4 12PLY STRL (GAUZE/BANDAGES/DRESSINGS) ×3 IMPLANT
GAUZE XEROFORM 1X8 LF (GAUZE/BANDAGES/DRESSINGS) ×3 IMPLANT
GAUZE XEROFORM 5X9 LF (GAUZE/BANDAGES/DRESSINGS) ×3 IMPLANT
GLOVE BIOGEL PI IND STRL 8.5 (GLOVE) ×1 IMPLANT
GLOVE BIOGEL PI INDICATOR 8.5 (GLOVE) ×2
GLOVE SURG ORTHO 8.0 STRL STRW (GLOVE) ×3 IMPLANT
GOWN STRL REUS W/ TWL LRG LVL3 (GOWN DISPOSABLE) ×2 IMPLANT
GOWN STRL REUS W/ TWL XL LVL3 (GOWN DISPOSABLE) ×1 IMPLANT
GOWN STRL REUS W/TWL LRG LVL3 (GOWN DISPOSABLE) ×6
GOWN STRL REUS W/TWL XL LVL3 (GOWN DISPOSABLE) ×3
K-WIRE 1.6 (WIRE) ×6
K-WIRE FX5X1.6XNS BN SS (WIRE) ×2
KIT BASIN OR (CUSTOM PROCEDURE TRAY) ×3 IMPLANT
KIT TURNOVER KIT B (KITS) ×3 IMPLANT
KWIRE FX5X1.6XNS BN SS (WIRE) IMPLANT
LOOP VESSEL MAXI BLUE (MISCELLANEOUS) IMPLANT
NDL HYPO 25GX1X1/2 BEV (NEEDLE) IMPLANT
NDL HYPO 25X1 1.5 SAFETY (NEEDLE) ×1 IMPLANT
NEEDLE HYPO 25GX1X1/2 BEV (NEEDLE) IMPLANT
NEEDLE HYPO 25X1 1.5 SAFETY (NEEDLE) ×3 IMPLANT
NS IRRIG 1000ML POUR BTL (IV SOLUTION) ×3 IMPLANT
PACK ORTHO EXTREMITY (CUSTOM PROCEDURE TRAY) ×3 IMPLANT
PAD ARMBOARD 7.5X6 YLW CONV (MISCELLANEOUS) ×6 IMPLANT
PAD CAST 4YDX4 CTTN HI CHSV (CAST SUPPLIES) ×2 IMPLANT
PADDING CAST COTTON 4X4 STRL (CAST SUPPLIES) ×6
PADDING CAST SYNTHETIC 3 NS LF (CAST SUPPLIES) ×2
PADDING CAST SYNTHETIC 3X4 NS (CAST SUPPLIES) IMPLANT
PADDING CAST SYNTHETIC 4 (CAST SUPPLIES) ×2
PADDING CAST SYNTHETIC 4X4 STR (CAST SUPPLIES) IMPLANT
PEG LOCKING SMOOTH 2.2X18 (Peg) ×2 IMPLANT
PEG LOCKING SMOOTH 2.2X20 (Screw) ×6 IMPLANT
PEG LOCKING SMOOTH 2.2X22 (Screw) ×4 IMPLANT
PLATE STANDARD DVR LEFT (Plate) ×3 IMPLANT
PLATE STD DVR LT 24X51 (Plate) IMPLANT
SCREW LOCK 14X2.7X 3 LD TPR (Screw) IMPLANT
SCREW LOCK 18X2.7X 3 LD TPR (Screw) IMPLANT
SCREW LOCKING 2.7X14 (Screw) ×9 IMPLANT
SCREW LOCKING 2.7X15MM (Screw) ×4 IMPLANT
SCREW LOCKING 2.7X18 (Screw) ×3 IMPLANT
SOAP 2 % CHG 4 OZ (WOUND CARE) ×3 IMPLANT
SPLINT FIBERGLASS 3X35 (CAST SUPPLIES) ×2 IMPLANT
SPONGE LAP 4X18 RFD (DISPOSABLE) ×3 IMPLANT
SUCTION FRAZIER HANDLE 10FR (MISCELLANEOUS)
SUCTION TUBE FRAZIER 10FR DISP (MISCELLANEOUS) IMPLANT
SUT PROLENE 4 0 PS 2 18 (SUTURE) IMPLANT
SUT VIC AB 2-0 CT1 27 (SUTURE)
SUT VIC AB 2-0 CT1 TAPERPNT 27 (SUTURE) IMPLANT
SUT VIC AB 3-0 FS2 27 (SUTURE) IMPLANT
SUT VICRYL 4-0 PS2 18IN ABS (SUTURE) IMPLANT
SYR CONTROL 10ML LL (SYRINGE) IMPLANT
SYSTEM CHEST DRAIN TLS 7FR (DRAIN) IMPLANT
TOWEL GREEN STERILE (TOWEL DISPOSABLE) ×3 IMPLANT
TOWEL GREEN STERILE FF (TOWEL DISPOSABLE) ×3 IMPLANT
TUBE CONNECTING 12'X1/4 (SUCTIONS) ×1
TUBE CONNECTING 12X1/4 (SUCTIONS) ×2 IMPLANT
UNDERPAD 30X36 HEAVY ABSORB (UNDERPADS AND DIAPERS) ×3 IMPLANT
WATER STERILE IRR 1000ML POUR (IV SOLUTION) ×3 IMPLANT
YANKAUER SUCT BULB TIP NO VENT (SUCTIONS) IMPLANT

## 2020-05-12 NOTE — Discharge Instructions (Addendum)
KEEP BANDAGE CLEAN AND DRY CALL OFFICE FOR F/U APPT [id number] in 13 days RX SENT TO [facility], PERCOCET, COLACE, ROBAXIN KEEP HAND ELEVATED ABOVE HEART OK TO APPLY ICE TO OPERATIVE AREA CONTACT OFFICE IF ANY WORSENING PAIN OR CONCERNS.

## 2020-05-12 NOTE — Op Note (Signed)
PREOPERATIVE DIAGNOSIS: Left wrist intra-articular distal radius fracture of 3 more fragments Left distal ulna fracture Left carpal tunnel syndrome  POSTOPERATIVE DIAGNOSIS: Same  ATTENDING SURGEON: Dr. Iran Planas who scrubbed and present for the entire procedure  ASSISTANT SURGEON: Gertie Fey, PA-C who is scrubbed and necessary for the entire procedure helped aid in reduction application internal fixation closure and splinting in a timely fashion  ANESTHESIA: Regional with IV sedation  OPERATIVE PROCEDURE: #1: Open treatment of left wrist intra-articular distal radius fracture 3 more fragments requiring internal fixation #2: Closed treatment of left distal ulnar shaft fracture without internal fixation with manipulation #3: Left wrist brachial radialis tendon tenotomy tendon release #4: Radiographs 3 views left wrist #5: Left hand carpal tunnel release  IMPLANTS: Biomet DVR cross lock with 2 cc stay graft bone graft substitute  RADIOGRAPHIC INTERPRETATION: AP lateral oblique views of the wrist do show the volar plate fixation in place with good alignment of the distal radius and of the distal ulnar shaft fracture  SURGICAL INDICATIONS: Patient is a right-hand-dominant female who sustained a fall on an outstretched left wrist. Patient initially underwent closed manipulation in [place]. Patient was seen evaluate in the office and recommended undergo the above procedure. The risks of surgery include but not limited to bleeding infection damage nearby nerves arteries or tendons nonunion malunion hardware failure loss of motion of the wrist and digits incomplete relief of symptoms and need for further surgical intervention  SURGICAL TECHNIQUE: Patient was palpated find the preoperative holding area marked the permanent marker made the left wrist indicate correct operative site. Patient brought back operating placed supine on the anesthesia table where the regional anesthetic had been  administered. The IV sedation was administered. Preoperative antibiotics were given for any skin incision. Well-padded tourniquet was then placed on the left brachium stay with the appropriate drape. Left upper extremities then prepped and draped normal sterile fashion. A timeout was called the correct site was then fine procedure then begun. Attention then turned the left hand several centimeter incision made directly in the mid palm. Dissection carried down through the skin and subcutaneous tissue. The palmar fascia was then identified and under direct visualization the distal one half of the transverse carpal ligament was then released. Further exposure carried out removing the remaining portion the transverse carpal ligament was then released under direct visualization. Careful dissection the median nerve was done throughout. The wound was then thoroughly irrigated. The skin was then closed using horizontal mattress and simple Prolene sutures. The patient did have a large amount of serous fluid within the carpal canal. Hematoma was also evacuated from within the region of the carpal canal.  After completion of the carpal tunnel attention was then turned the distal radius. Several centimeters was made directly over the FCR sheath. Dissection carried down through the skin and subcutaneous tissue. The FCR sheath was then opened proximally distally. The FPL was then swept out away in an L-shaped pronator quadratus flap was then elevated. This exposed the fracture site. In order to obtain reduction along the radial column the brachioradialis was then carefully released off the radial styloid. Tendon tenotomy was then carried out in order to expose the intra-articular fracture along the radial column and to aid in exposure separate procedure. The wound was then thoroughly irrigated. The patient did have several cortical fragments on the volar aspect one was removed and one was left that had the soft tissue  attachment along the volar ulnar corner. The wound when hematoma  was then evacuated out the fracture site open reduction was then performed. The patient did have the significant metaphyseal comminution and defect. Several cc of the bone graft substitute were then packed into this defect and the volar plate was then applied and held in place with a K wire. Plate position was then confirmed using the mini C arm. This was an intra-articular fracture of 3 more fragments. Once reduction was achieved with a K wire and the plate in place the oblong screw hole was then placed proximally. Distal PEG fixation was then carried out with the locking pegs and 1 locking screw in the radial styloid. Final fixation was then carried out in the shaft with a combination of locking nonlocking screws in the shaft. Final radiographs were then obtained which showed good alignment. Close manipulation had been performed of the distal ulna. The distal ulna lined up nicely. There is a distal ulnar shaft fracture. This lined up nicely and closed treatment was done without internal fixation. Final wound irrigation was then done. The pronator quadratus was then closed with 2-0 Vicryl subcutaneous tissues closed with 4-0 Vicryl. The skin was then closed using simple Prolene sutures. Adaptic and Xeroform dressings were then applied. Sterile compressive bandage applied. The patient then placed in a well-padded sugar tong splint taken recovery room in good condition.  POSTOPERATIVE PLAN: Patient be discharged to home. See him back in the office in approximately 12 to 13 days for wound check suture removal likely application of a short arm cast then begin a therapy regimen the 4-week mark. We will place the therapy order at the first postoperative visit. Radiographs at each visit.

## 2020-05-12 NOTE — Anesthesia Procedure Notes (Addendum)
Procedure Name: MAC Date/Time: 05/12/2020 4:39 PM Performed by: Janene Harvey, CRNA Pre-anesthesia Checklist: Patient identified, Emergency Drugs available, Suction available and Patient being monitored Patient Re-evaluated:Patient Re-evaluated prior to induction Oxygen Delivery Method: Simple face mask Induction Type: IV induction Placement Confirmation: positive ETCO2 Dental Injury: Teeth and Oropharynx as per pre-operative assessment

## 2020-05-12 NOTE — Anesthesia Preprocedure Evaluation (Addendum)
Anesthesia Evaluation  Patient identified by MRN, date of birth, ID band Patient awake    Reviewed: Allergy & Precautions, NPO status , Patient's Chart, lab work & pertinent test results  Airway Mallampati: I       Dental no notable dental hx. (+) Teeth Intact   Pulmonary neg pulmonary ROS,    Pulmonary exam normal breath sounds clear to auscultation       Cardiovascular negative cardio ROS Normal cardiovascular exam Rhythm:Regular Rate:Normal     Neuro/Psych  Headaches, negative psych ROS   GI/Hepatic negative GI ROS, Neg liver ROS,   Endo/Other  negative endocrine ROS  Renal/GU   negative genitourinary   Musculoskeletal   Abdominal Normal abdominal exam  (+)   Peds  Hematology negative hematology ROS (+)   Anesthesia Other Findings   Reproductive/Obstetrics                            Anesthesia Physical Anesthesia Plan  ASA: II  Anesthesia Plan: MAC and Regional   Post-op Pain Management:  Regional for Post-op pain   Induction:   PONV Risk Score and Plan: Propofol infusion  Airway Management Planned: Natural Airway, Nasal Cannula and Simple Face Mask  Additional Equipment: None  Intra-op Plan:   Post-operative Plan:   Informed Consent: I have reviewed the patients History and Physical, chart, labs and discussed the procedure including the risks, benefits and alternatives for the proposed anesthesia with the patient or authorized representative who has indicated his/her understanding and acceptance.       Plan Discussed with: CRNA  Anesthesia Plan Comments:         Anesthesia Quick Evaluation

## 2020-05-12 NOTE — Transfer of Care (Signed)
Immediate Anesthesia Transfer of Care Note  Patient: [name]  Procedure(s) Performed: OPEN REDUCTION INTERNAL FIXATION (ORIF) ULNA / DISTAL RADIAL FRACTURE (Left ) CARPAL TUNNEL RELEASE (Left )  Patient Location: PACU  Anesthesia Type:MAC and Regional  Level of Consciousness: drowsy  Airway & Oxygen Therapy: Patient Spontanous Breathing and Patient connected to face mask oxygen  Post-op Assessment: Report given to RN and Post -op Vital signs reviewed and stable  Post vital signs: Reviewed  Last Vitals:  Vitals Value Taken Time  BP 91/56 05/12/20 1815  Temp    Pulse 58 05/12/20 1817  Resp 16 05/12/20 1817  SpO2 96 % 05/12/20 1817  Vitals shown include unvalidated device data.  Last Pain:  Vitals:   05/12/20 1511  TempSrc:   PainSc: 5       Patients Stated Pain Goal: 0 (68/08/81 1031)  Complications: No apparent anesthesia complications

## 2020-05-13 DIAGNOSIS — S52572A Other intraarticular fracture of lower end of left radius, initial encounter for closed fracture: Secondary | ICD-10-CM | POA: Diagnosis not present

## 2020-05-13 MED ORDER — CLONIDINE HCL (ANALGESIA) 100 MCG/ML EP SOLN
EPIDURAL | Status: DC | PRN
  Administered 2020-05-13: 50 ug

## 2020-05-13 MED ORDER — ROPIVACAINE HCL 7.5 MG/ML IJ SOLN
INTRAMUSCULAR | Status: DC | PRN
  Administered 2020-05-12 (×4): 5 mL via PERINEURAL

## 2020-05-13 NOTE — Anesthesia Procedure Notes (Addendum)
Anesthesia Regional Block: Supraclavicular block   Pre-Anesthetic Checklist: ,, timeout performed, Correct Patient, Correct Site, Correct Laterality, Correct Procedure, Correct Position, site marked, Risks and benefits discussed,  Surgical consent,  Pre-op evaluation,  At surgeon's request and post-op pain management  Laterality: Upper and Left  Prep: chloraprep       Needles:  Injection technique: Single-shot  Needle Type: Echogenic Stimulator Needle     Needle Length: 9cm  Needle Gauge: 20   Needle insertion depth: 1 cm   Additional Needles:   Procedures:,,,, ultrasound used (permanent image in chart),,,,  Narrative:  Start time: 05/12/2020 4:05 PM End time: 05/12/2020 4:13 PM Injection made incrementally with aspirations every 5 mL.  Performed by: Personally  Anesthesiologist: Leilani Able, MD

## 2020-05-13 NOTE — Anesthesia Postprocedure Evaluation (Signed)
Anesthesia Post Note  Patient: Danille Oppedisano Dupre  Procedure(s) Performed: OPEN REDUCTION INTERNAL FIXATION (ORIF) ULNA / DISTAL RADIAL FRACTURE (Left ) CARPAL TUNNEL RELEASE (Left )     Patient location during evaluation: PACU Anesthesia Type: Regional and MAC Level of consciousness: awake Pain management: pain level controlled Vital Signs Assessment: post-procedure vital signs reviewed and stable Respiratory status: spontaneous breathing Cardiovascular status: stable Postop Assessment: no apparent nausea or vomiting Anesthetic complications: no    Last Vitals:  Vitals:   05/12/20 1454 05/12/20 1900  BP: 127/80 103/69  Pulse: 64 (!) 54  Resp: 18 15  Temp: 36.5 C (!) 36.4 C  SpO2: 98% 99%    Last Pain:  Vitals:   05/12/20 1900  TempSrc:   PainSc: 0-No pain   Pain Goal: Patients Stated Pain Goal: 0 (05/12/20 1511)                 Huston Foley

## 2020-05-14 ENCOUNTER — Encounter: Payer: Self-pay | Admitting: *Deleted

## 2022-07-04 ENCOUNTER — Other Ambulatory Visit (HOSPITAL_COMMUNITY): Payer: Self-pay | Admitting: Internal Medicine

## 2022-07-04 ENCOUNTER — Other Ambulatory Visit: Payer: Self-pay | Admitting: Internal Medicine

## 2022-07-12 ENCOUNTER — Other Ambulatory Visit (HOSPITAL_COMMUNITY): Payer: Self-pay | Admitting: Internal Medicine

## 2022-07-12 ENCOUNTER — Other Ambulatory Visit: Payer: Self-pay | Admitting: Internal Medicine

## 2022-07-13 ENCOUNTER — Other Ambulatory Visit: Payer: Self-pay | Admitting: Internal Medicine

## 2022-07-13 ENCOUNTER — Other Ambulatory Visit (HOSPITAL_COMMUNITY): Payer: Self-pay | Admitting: Internal Medicine

## 2022-07-14 ENCOUNTER — Other Ambulatory Visit (HOSPITAL_COMMUNITY): Payer: Self-pay | Admitting: Internal Medicine

## 2022-07-14 DIAGNOSIS — Z0189 Encounter for other specified special examinations: Secondary | ICD-10-CM

## 2022-07-18 ENCOUNTER — Other Ambulatory Visit (HOSPITAL_COMMUNITY): Payer: Self-pay | Admitting: Internal Medicine

## 2022-07-18 DIAGNOSIS — M25811 Other specified joint disorders, right shoulder: Secondary | ICD-10-CM

## 2022-07-26 ENCOUNTER — Ambulatory Visit (HOSPITAL_COMMUNITY)
Admission: RE | Admit: 2022-07-26 | Discharge: 2022-07-26 | Disposition: A | Discharge status: Home / Self Care | Payer: No Typology Code available for payment source | Source: Ambulatory Visit | Attending: Internal Medicine | Admitting: Internal Medicine

## 2022-07-26 ENCOUNTER — Other Ambulatory Visit (HOSPITAL_COMMUNITY): Payer: Self-pay | Admitting: Internal Medicine

## 2022-07-26 DIAGNOSIS — Z0189 Encounter for other specified special examinations: Secondary | ICD-10-CM | POA: Diagnosis present

## 2022-07-26 DIAGNOSIS — S00259A Superficial foreign body of unspecified eyelid and periocular area, initial encounter: Secondary | ICD-10-CM

## 2022-07-26 MED ORDER — GADOBUTROL 1 MMOL/ML IV SOLN
6.0000 mL | Freq: Once | INTRAVENOUS | Status: AC | PRN
  Administered 2022-07-26: 6 mL via INTRAVENOUS

## 2024-10-19 ENCOUNTER — Emergency Department (HOSPITAL_COMMUNITY)

## 2024-10-19 ENCOUNTER — Emergency Department (HOSPITAL_COMMUNITY)
Admission: EM | Admit: 2024-10-19 | Discharge: 2024-10-19 | Disposition: A | Discharge status: Home / Self Care | Attending: Emergency Medicine | Admitting: Emergency Medicine

## 2024-10-19 ENCOUNTER — Encounter (HOSPITAL_COMMUNITY): Payer: Self-pay

## 2024-10-19 ENCOUNTER — Other Ambulatory Visit: Payer: Self-pay

## 2024-10-19 DIAGNOSIS — W228XXA Striking against or struck by other objects, initial encounter: Secondary | ICD-10-CM | POA: Diagnosis not present

## 2024-10-19 DIAGNOSIS — Z7982 Long term (current) use of aspirin: Secondary | ICD-10-CM | POA: Diagnosis not present

## 2024-10-19 DIAGNOSIS — S81812A Laceration without foreign body, left lower leg, initial encounter: Secondary | ICD-10-CM | POA: Diagnosis not present

## 2024-10-19 DIAGNOSIS — S80922A Unspecified superficial injury of left lower leg, initial encounter: Secondary | ICD-10-CM | POA: Diagnosis present

## 2024-10-19 DIAGNOSIS — Z23 Encounter for immunization: Secondary | ICD-10-CM | POA: Diagnosis not present

## 2024-10-19 MED ORDER — IBUPROFEN 400 MG PO TABS
400.0000 mg | ORAL_TABLET | Freq: Once | ORAL | Status: AC | PRN
  Administered 2024-10-19: 400 mg via ORAL
  Filled 2024-10-19: qty 1

## 2024-10-19 MED ORDER — LIDOCAINE-EPINEPHRINE (PF) 2 %-1:200000 IJ SOLN
20.0000 mL | Freq: Once | INTRAMUSCULAR | Status: AC
  Administered 2024-10-19: 20 mL via INTRADERMAL
  Filled 2024-10-19: qty 20

## 2024-10-19 MED ORDER — TETANUS-DIPHTH-ACELL PERTUSSIS 5-2-15.5 LF-MCG/0.5 IM SUSP
0.5000 mL | Freq: Once | INTRAMUSCULAR | Status: AC
  Administered 2024-10-19: 0.5 mL via INTRAMUSCULAR
  Filled 2024-10-19: qty 0.5

## 2024-10-19 NOTE — ED Triage Notes (Addendum)
 Pt presents for a laceration to L. Leg.  States concern for bone exposure.

## 2024-10-19 NOTE — ED Provider Notes (Signed)
 [place] EMERGENCY DEPARTMENT AT [facility] Provider Note   CSN: [id number] Arrival date & time: 10/19/24  1430     Patient presents with: No chief complaint on file.   [name] is a 73 y.o. female with PMHx OA, IDA who presents to ED concern for left lower leg injury.  Patient was standing on a wooden stepstool when it broke underneath her and caused a laceration to her left shin.  Patient denies any other injury or pain today.  Patient unsure of last tetanus dose.   HPI     Prior to Admission medications   Medication Sig Start Date End Date Taking? Authorizing Provider  amoxicillin (AMOXIL) 500 MG capsule Take 2,000 mg by mouth See admin instructions. Takes 2000mg  amoxicillin prior to dental procedure.    [provider]  aspirin  EC 81 MG tablet Take 81 mg by mouth daily.    [provider]  Biotin  10000 MCG TABS Take 10,000 mg by mouth daily.     [provider]  Calcium  Carbonate-Vit D-Min (CALCIUM  1200 PO) Take 1,200 mg by mouth daily.    [provider]  Cholecalciferol  50 MCG (2000 UT) TABS Take 2,000 Units by mouth daily.    [provider]  docusate sodium  (COLACE) 100 MG capsule Take 1 capsule (100 mg total) by mouth 2 (two) times daily. 01/21/19   Danella Cough, PA-C  ibuprofen (ADVIL) 800 MG tablet Take 800 mg by mouth 2 (two) times daily as needed. 05/06/20   [provider]  Magnesium  400 MG CAPS Take 400 mg by mouth daily.     [provider]  Multiple Vitamins-Minerals (MULTIVITAMIN PO) Take 1 tablet by mouth daily.    [provider]  pyridOXINE  (VITAMIN B-6) 100 MG tablet Take 100 mg by mouth daily.    [provider]  traMADol (ULTRAM) 50 MG tablet Take 50 mg by mouth 3 (three) times daily as needed for pain. 05/06/20   [provider]    Allergies: Patient has no known allergies.    Review of Systems  Skin:  Positive for wound.    Updated Vital  Signs BP 109/86 (BP Location: Right Arm)   Pulse 70   Temp 98.1 F (36.7 C) (Oral)   Resp 16   Ht 5' 5 (1.651 m)   Wt 61.2 kg   SpO2 97%   BMI 22.47 kg/m   Physical Exam Vitals and nursing note reviewed.  Constitutional:      General: She is not in acute distress.    Appearance: She is not ill-appearing or toxic-appearing.  HENT:     Head: Normocephalic and atraumatic.  Eyes:     General: No scleral icterus.       Right eye: No discharge.        Left eye: No discharge.     Conjunctiva/sclera: Conjunctivae normal.  Cardiovascular:     Rate and Rhythm: Normal rate.  Pulmonary:     Effort: Pulmonary effort is normal.  Abdominal:     General: Abdomen is flat.  Skin:    General: Skin is warm and dry.     Comments: 4cm linear laceration/abrasion on left anterior shin. +2 pedal pulse. Sensation to light touch intact. Area non-tense. Wound hemostatic. No foreign bodies appreciated.   Neurological:     General: No focal deficit present.     Mental Status: She is alert. Mental status is at baseline.     Comments: GCS 15.  Speech is goal oriented. No deficits appreciated to CN III-XII. Patient moves extremities without ataxia. Normal strength in BL LE.   Psychiatric:        Mood and Affect: Mood normal.        Behavior: Behavior normal.     (all labs ordered are listed, but only abnormal results are displayed) Labs Reviewed - No data to display  EKG: None  Radiology: DG Tibia/Fibula Left Result Date: 10/19/2024 CLINICAL DATA:  Fall, left leg laceration. EXAM: LEFT TIBIA AND FIBULA - 2 VIEW COMPARISON:  None Available. FINDINGS: Soft tissue swelling and small locules of air along the anterior margin of the mid tibia. No radiopaque foreign body or underlying osseous abnormality. Left knee arthroplasty. IMPRESSION: Soft tissue injury along the anterior aspect of the tibia without underlying radiopaque foreign body or fracture. Electronically Signed   By: Newell Eke M.D.    On: 10/19/2024 15:39     .Laceration Repair  Date/Time: 10/19/2024 4:15 PM  Performed by: Hoy Nidia FALCON, PA-C Authorized by: Hoy Nidia FALCON, PA-C   Consent:    Consent obtained:  Verbal   Consent given by:  Patient   Risks, benefits, and alternatives were discussed: yes     Risks discussed:  Infection, need for additional repair, nerve damage, pain, poor cosmetic result, poor wound healing, tendon damage, vascular damage and retained foreign body Universal protocol:    Procedure explained and questions answered to patient or proxy's satisfaction: yes   Anesthesia:    Anesthesia method:  Local infiltration   Local anesthetic:  Lidocaine  2% WITH epi Laceration details:    Location:  Leg   Leg location:  L lower leg   Length (cm):  4 Pre-procedure details:    Preparation:  Patient was prepped and draped in usual sterile fashion Exploration:    Limited defect created (wound extended): yes     Hemostasis achieved with:  Direct pressure   Imaging obtained: x-ray     Imaging outcome: foreign body not noted   Treatment:    Area cleansed with:  Povidone-iodine   Amount of cleaning:  Standard   Irrigation solution:  Sterile saline   Irrigation volume:  Skin repair:    Repair method:  Sutures   Suture size:  4-0   Suture material:  Prolene   Suture technique:  Simple interrupted   Number of sutures:  4 Approximation:    Approximation:  Close Repair type:    Repair type:  Simple Post-procedure details:    Dressing:  Antibiotic ointment and non-adherent dressing   Procedure completion:  Tolerated well, no immediate complications    Medications Ordered in the ED  lidocaine -EPINEPHrine (XYLOCAINE  W/EPI) 2 %-1:200000 (PF) injection 20 mL (has no administration in time range)  ibuprofen (ADVIL) tablet 400 mg (400 mg Oral Given 10/19/24 1449)  Tdap (ADACEL) injection 0.5 mL (0.5 mLs Intramuscular Given 10/19/24 1540)                                    Medical  Decision Making Amount and/or Complexity of Data Reviewed Radiology: ordered.  Risk Prescription drug management.   This patient presents to the ED for concern of a  4 cm simple laceration to their left lower extremity, this involves an extensive number of treatment options, and is a complaint that carries with it a high risk of complications and morbidity.  The differential diagnosis includes  foreign body, fracture, NV compromise. These are considered less likely due to history of present illness and physical exam findings.   Co morbidities that complicate the patient evaluation  OA, IDA    Additional history obtained:  [facility]   Imaging Studies ordered:  I ordered imaging studies including left tibia/fibula xray  I independently visualized and interpreted imaging which showed soft tissue wound without acute osseous abnormalities or visualized foreign bodies. I agree with the radiologist interpretation   Problem List / ED Course / Critical interventions / Medication management  Patient presented for a 4 cm laceration/abrasion to their left anterior shin. They are neurovascularly intact. Tetanus was provided in ED today. Patient is in no distress. Laceration will be repaired with standard wound care procedures and antibiotic ointment. The laceration is not through infected skin, associated with deep puncture wounds, >8hrs old, or grossly contaminated with foreign debris, so I will be using sutures for primary closure. Patient educated about specific return precautions for given chief complaint and symptoms.  Patient educated about follow-up with PCP/UC for suture removal. Laceration repaired without complication and with pulse, motor, sensation intact. Patient expressed understanding of return precautions and need for follow-up. All education provided in verbal form with additional information in written form. Time was allowed for answering of patient  questions. Staffed with Dr. Francesca who agrees with plan. I have reviewed the patients home medicines and have made adjustments as needed The patient has been appropriately medically screened and/or stabilized in the ED. I have low suspicion for any other emergent medical condition which would require further screening, evaluation or treatment in the ED or require inpatient management. At time of discharge the patient is hemodynamically stable and in no acute distress. I have discussed work-up results and diagnosis with patient and answered all questions. Patient is agreeable with discharge plan. We discussed strict return precautions for returning to the emergency department and they verbalized understanding.     Social Determinants of Health:  geriatric         Final diagnoses:  Laceration of left lower extremity, initial encounter    ED Discharge Orders     None          Hoy Nidia FALCON, [place] 10/19/24 1619    Francesca Elsie CROME, MD 10/22/24 1535

## 2024-10-19 NOTE — Discharge Instructions (Addendum)
 Non-asorbable sutures: Keep sutures dry. Please refrain from swimming or bathing. Area can be gently cleaned with mild soap and water after 24 hours. Please return to local urgent care or primary care in 8 to 10 days for suture removal. Failure to remove sutures in timely manner can result in infection.  Seek emergency care if experiencing any new or worsening symptoms such as spreading erythema or purulence from wound.  Alternating between 650 mg Tylenol  and 400 mg Advil: The best way to alternate taking Acetaminophen  (example Tylenol ) and Ibuprofen (example Advil/Motrin) is to take them 3 hours apart. For example, if you take ibuprofen at 6 am you can then take Tylenol  at 9 am. You can continue this regimen throughout the day, making sure you do not exceed the recommended maximum dose for each drug.

## 2024-10-26 ENCOUNTER — Encounter (HOSPITAL_COMMUNITY): Payer: Self-pay | Admitting: Emergency Medicine

## 2024-10-26 ENCOUNTER — Emergency Department (HOSPITAL_COMMUNITY)
Admission: EM | Admit: 2024-10-26 | Discharge: 2024-10-26 | Disposition: A | Discharge status: Home / Self Care | Attending: Emergency Medicine | Admitting: Emergency Medicine

## 2024-10-26 ENCOUNTER — Emergency Department (HOSPITAL_COMMUNITY)

## 2024-10-26 ENCOUNTER — Other Ambulatory Visit: Payer: Self-pay

## 2024-10-26 DIAGNOSIS — Z4802 Encounter for removal of sutures: Secondary | ICD-10-CM | POA: Diagnosis not present

## 2024-10-26 DIAGNOSIS — L089 Local infection of the skin and subcutaneous tissue, unspecified: Secondary | ICD-10-CM | POA: Insufficient documentation

## 2024-10-26 DIAGNOSIS — R2242 Localized swelling, mass and lump, left lower limb: Secondary | ICD-10-CM | POA: Diagnosis present

## 2024-10-26 LAB — COMPREHENSIVE METABOLIC PANEL WITH GFR
ALT: 24 U/L (ref 0–44)
AST: 27 U/L (ref 15–41)
Albumin: 3.7 g/dL (ref 3.5–5.0)
Alkaline Phosphatase: 49 U/L (ref 38–126)
Anion gap: 10 (ref 5–15)
BUN: 16 mg/dL (ref 8–23)
CO2: 27 mmol/L (ref 22–32)
Calcium: 8.9 mg/dL (ref 8.9–10.3)
Chloride: 103 mmol/L (ref 98–111)
Creatinine, Ser: 0.73 mg/dL (ref 0.44–1.00)
GFR, Estimated: >60 mL/min (ref >60)
Glucose, Bld: 109 mg/dL — ABNORMAL HIGH (ref 70–99)
Potassium: 3.9 mmol/L (ref 3.5–5.1)
Sodium: 140 mmol/L (ref 135–145)
Total Bilirubin: 0.8 mg/dL (ref 0.0–1.2)
Total Protein: 6.5 g/dL (ref 6.5–8.1)

## 2024-10-26 LAB — CBC WITH DIFFERENTIAL/PLATELET
Abs Immature Granulocytes: 0.01 K/uL (ref 0.00–0.07)
Basophils Absolute: 0.1 K/uL (ref 0.0–0.1)
Basophils Relative: 1 %
Eosinophils Absolute: 0.4 K/uL (ref 0.0–0.5)
Eosinophils Relative: 7 %
HCT: 35.2 % — ABNORMAL LOW (ref 36.0–46.0)
Hemoglobin: 11.6 g/dL — ABNORMAL LOW (ref 12.0–15.0)
Immature Granulocytes: 0 %
Lymphocytes Relative: 28 %
Lymphs Abs: 1.4 K/uL (ref 0.7–4.0)
MCH: 30.5 pg (ref 26.0–34.0)
MCHC: 33 g/dL (ref 30.0–36.0)
MCV: 92.6 fL (ref 80.0–100.0)
Monocytes Absolute: 0.5 K/uL (ref 0.1–1.0)
Monocytes Relative: 10 %
Neutro Abs: 2.7 K/uL (ref 1.7–7.7)
Neutrophils Relative %: 54 %
Platelets: 299 K/uL (ref 150–400)
RBC: 3.8 MIL/uL — ABNORMAL LOW (ref 3.87–5.11)
RDW: 13.3 % (ref 11.5–15.5)
WBC: 5.1 K/uL (ref 4.0–10.5)
nRBC: 0 % (ref 0.0–0.2)

## 2024-10-26 MED ORDER — DOXYCYCLINE HYCLATE 100 MG PO TABS
100.0000 mg | ORAL_TABLET | Freq: Once | ORAL | Status: AC
  Administered 2024-10-26: 100 mg via ORAL
  Filled 2024-10-26: qty 1

## 2024-10-26 MED ORDER — DOXYCYCLINE HYCLATE 100 MG PO CAPS: 100.0000 mg | ORAL_CAPSULE | Freq: Two times a day (BID) | ORAL | 0 refills | Status: AC

## 2024-10-26 MED ORDER — ACETAMINOPHEN 500 MG PO TABS
1000.0000 mg | ORAL_TABLET | Freq: Once | ORAL | Status: AC
  Administered 2024-10-26: 1000 mg via ORAL
  Filled 2024-10-26: qty 2

## 2024-10-26 NOTE — ED Provider Notes (Signed)
 [place] EMERGENCY DEPARTMENT AT [facility] Provider Note   CSN: [id number] Arrival date & time: 10/26/24  9452     Patient presents with: Extremity Laceration and Wound Infection  HPI [name] is a 73 y.o. female presenting for possible wound infection associated with repaired laceration over the left mid shin.  She states she was here about a week ago for treatment.  States a metal piece of a stool lacerated her mid shin.  She had 4 sutures placed and was sent home.  She notes redness that started this past Friday with increased swelling around the site along with tenderness.  She denies bleeding, pustulant oozing or dehiscence.  Has not been on antibiotics.  Denies fever.   HPI     Prior to Admission medications   Medication Sig Start Date End Date Taking? Authorizing Provider  doxycycline  (VIBRAMYCIN ) 100 MG capsule Take 1 capsule (100 mg total) by mouth 2 (two) times daily. 10/26/24  Yes Damione Robideau K, PA-C  amoxicillin (AMOXIL) 500 MG capsule Take 2,000 mg by mouth See admin instructions. Takes 2000mg  amoxicillin prior to dental procedure.    [provider]  aspirin  EC 81 MG tablet Take 81 mg by mouth daily.    [provider]  Biotin  10000 MCG TABS Take 10,000 mg by mouth daily.     [provider]  Calcium  Carbonate-Vit D-Min (CALCIUM  1200 PO) Take 1,200 mg by mouth daily.    [provider]  Cholecalciferol  50 MCG (2000 UT) TABS Take 2,000 Units by mouth daily.    [provider]  docusate sodium  (COLACE) 100 MG capsule Take 1 capsule (100 mg total) by mouth 2 (two) times daily. 01/21/19   Danella Cough, PA-C  ibuprofen (ADVIL) 800 MG tablet Take 800 mg by mouth 2 (two) times daily as needed. 05/06/20   [provider]  Magnesium  400 MG CAPS Take 400 mg by mouth daily.     [provider]  Multiple Vitamins-Minerals (MULTIVITAMIN PO) Take 1 tablet by mouth daily.    [provider]   pyridOXINE  (VITAMIN B-6) 100 MG tablet Take 100 mg by mouth daily.    [provider]  traMADol (ULTRAM) 50 MG tablet Take 50 mg by mouth 3 (three) times daily as needed for pain. 05/06/20   [provider]    Allergies: Patient has no known allergies.    Review of Systems See HPI  Updated Vital Signs BP (!) 155/87 (BP Location: Right Arm)   Pulse 83   Temp 97.8 F (36.6 C) (Oral)   Resp 18   Ht 5' 5 (1.651 m)   Wt 61 kg   SpO2 98%   BMI 22.38 kg/m   Physical Exam Constitutional:      Appearance: Normal appearance.  HENT:     Head: Normocephalic.     Nose: Nose normal.  Eyes:     Conjunctiva/sclera: Conjunctivae normal.  Pulmonary:     Effort: Pulmonary effort is normal.  Musculoskeletal:       Legs:  Neurological:     Mental Status: She is alert.  Psychiatric:        Mood and Affect: Mood normal.     (all labs ordered are listed, but only abnormal results are displayed) Labs Reviewed  COMPREHENSIVE METABOLIC PANEL WITH GFR - Abnormal; Notable for the following components:      Result Value   Glucose, Bld 109 (*)    All other components within normal  limits  CBC WITH DIFFERENTIAL/PLATELET - Abnormal; Notable for the following components:   RBC 3.80 (*)    Hemoglobin 11.6 (*)    HCT 35.2 (*)    All other components within normal limits    EKG: None  Radiology: DG Tibia/Fibula Left Result Date: 10/26/2024 EXAM: 2 VIEW(S) XRAY OF THE LEFT TIBIA AND FIBULA 10/26/2024 06:27:26 AM COMPARISON: Comparison is made with a similar study from 10/19/2024. CLINICAL HISTORY: wound wound FINDINGS: BONES AND JOINTS: Left knee arthroplasty is present, with no evidence of hardware loosening or failure. No acute fracture. No focal osseous lesion. No joint dislocation. SOFT TISSUES: There is mild stranding edema in the mid pretibial area, improved from the prior study. No radiopaque foreign body or soft tissue gas are seen. No other focal significant soft  tissue finding. IMPRESSION: 1. Mild pretibial soft tissue edema, decreased from prior study. 2. No radiopaque foreign body or soft tissue gas. 3. Left knee arthroplasty without evidence of loosening or failure. 4. No evidence of fractures or other focal pathologic processes of bone. Electronically signed by: Francis Quam MD 10/26/2024 06:58 AM EST RP Workstation: [id number]     Suture Removal  Date/Time: 10/26/2024 7:34 AM  Performed by: Lang Norleen POUR, PA-C Authorized by: Lang Norleen POUR, PA-C   Consent:    Consent obtained:  Verbal   Consent given by:  Patient   Risks discussed:  Bleeding and pain   Alternatives discussed:  No treatment Universal protocol:    Procedure explained and questions answered to patient or proxy's satisfaction: yes     Relevant documents present and verified: yes     Test results available: yes     Patient identity confirmed:  Verbally with patient and hospital-assigned identification number Location:    Location:  Lower extremity   Lower extremity location:  Leg   Leg location:  L lower leg Procedure details:    Wound appearance:  Indurated, tender, warm, nonpurulent and red   Number of sutures removed:  4 Post-procedure details:    Post-removal:  Dressing applied   Procedure completion:  Tolerated well, no immediate complications    Medications Ordered in the ED  acetaminophen  (TYLENOL ) tablet 1,000 mg (1,000 mg Oral Given 10/26/24 0656)  doxycycline  (VIBRA -TABS) tablet 100 mg (100 mg Oral Given 10/26/24 0656)                                    Medical Decision Making Amount and/or Complexity of Data Reviewed Labs: ordered. Radiology: ordered.   73 year old well-appearing female presenting for wound reevaluation after left mid shin laceration repair last week.  Exam findings were concerning for possible associated cellulitis.  On exam it does not appear to be a collection of fluid and findings seem to be localized around the wound.  Xray  showed pretibial edema but no free air or osseous abnormality.  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  Gave her Tylenol  and started doxycycline .  Advised her to follow-up with her PCP and discussed strict return precautions.  She was agreeable with this plan. Disharged in good condition.      Final diagnoses:  Wound infection    ED Discharge Orders          Ordered    doxycycline  (VIBRAMYCIN ) 100 MG capsule  2 times daily        10/26/24 0754  Lang Norleen POUR, PA-C 10/26/24 9245    Jerral Meth, MD 10/26/24 2306

## 2024-10-26 NOTE — Discharge Instructions (Addendum)
 Evaluation today was concerning that you may have an infection associated with your recent laceration repair.  I am starting on doxycycline .  You can also take Tylenol  at home for pain.  Please be mindful of your symptoms of the area becomes more red, swollen starts to ooze pus or you develop a fever or any other concerning symptom please return to ED for further evaluation.  Otherwise recommend you follow-up with PCP.

## 2024-10-26 NOTE — ED Triage Notes (Signed)
 Patient here w/ laceration that happened a week ago from a step stool breaking- a metal piece of it went into her shin. There are four sutures on the shin area of the L leg, but now this morning notes increased redness around the laceration, increased swelling and some slight warmth. Was supposed to have sutures removed today however due to the presentation of the laceration is seeking further eval of the wound. Did receive Tdap last week.

## 2024-11-01 ENCOUNTER — Emergency Department (HOSPITAL_COMMUNITY)

## 2024-11-01 ENCOUNTER — Other Ambulatory Visit: Payer: Self-pay

## 2024-11-01 ENCOUNTER — Emergency Department (HOSPITAL_COMMUNITY)
Admission: EM | Admit: 2024-11-01 | Discharge: 2024-11-01 | Disposition: A | Discharge status: Home / Self Care | Attending: Emergency Medicine | Admitting: Emergency Medicine

## 2024-11-01 DIAGNOSIS — Z4801 Encounter for change or removal of surgical wound dressing: Secondary | ICD-10-CM | POA: Insufficient documentation

## 2024-11-01 DIAGNOSIS — Z5189 Encounter for other specified aftercare: Secondary | ICD-10-CM

## 2024-11-01 LAB — I-STAT CHEM 8, ED
BUN: 18 mg/dL (ref 8–23)
Calcium, Ion: 1.16 mmol/L (ref 1.15–1.40)
Chloride: 106 mmol/L (ref 98–111)
Creatinine, Ser: 1.1 mg/dL — ABNORMAL HIGH (ref 0.44–1.00)
Glucose, Bld: 135 mg/dL — ABNORMAL HIGH (ref 70–99)
HCT: 38 % (ref 36.0–46.0)
Hemoglobin: 12.9 g/dL (ref 12.0–15.0)
Potassium: 4.1 mmol/L (ref 3.5–5.1)
Sodium: 139 mmol/L (ref 135–145)
TCO2: 24 mmol/L (ref 22–32)

## 2024-11-01 LAB — COMPREHENSIVE METABOLIC PANEL WITH GFR
ALT: 29 U/L (ref 0–44)
AST: 33 U/L (ref 15–41)
Albumin: 4.2 g/dL (ref 3.5–5.0)
Alkaline Phosphatase: 55 U/L (ref 38–126)
Anion gap: 11 (ref 5–15)
BUN: 16 mg/dL (ref 8–23)
CO2: 23 mmol/L (ref 22–32)
Calcium: 9.2 mg/dL (ref 8.9–10.3)
Chloride: 105 mmol/L (ref 98–111)
Creatinine, Ser: 0.98 mg/dL (ref 0.44–1.00)
GFR, Estimated: >60 mL/min
Glucose, Bld: 127 mg/dL — ABNORMAL HIGH (ref 70–99)
Potassium: 4 mmol/L (ref 3.5–5.1)
Sodium: 139 mmol/L (ref 135–145)
Total Bilirubin: 0.7 mg/dL (ref 0.0–1.2)
Total Protein: 7.2 g/dL (ref 6.5–8.1)

## 2024-11-01 LAB — CBC WITH DIFFERENTIAL/PLATELET
Abs Immature Granulocytes: 0.03 K/uL (ref 0.00–0.07)
Basophils Absolute: 0.1 K/uL (ref 0.0–0.1)
Basophils Relative: 1 %
Eosinophils Absolute: 0.2 K/uL (ref 0.0–0.5)
Eosinophils Relative: 3 %
HCT: 37.3 % (ref 36.0–46.0)
Hemoglobin: 12.6 g/dL (ref 12.0–15.0)
Immature Granulocytes: 1 %
Lymphocytes Relative: 24 %
Lymphs Abs: 1.6 K/uL (ref 0.7–4.0)
MCH: 30.6 pg (ref 26.0–34.0)
MCHC: 33.8 g/dL (ref 30.0–36.0)
MCV: 90.5 fL (ref 80.0–100.0)
Monocytes Absolute: 0.6 K/uL (ref 0.1–1.0)
Monocytes Relative: 9 %
Neutro Abs: 4.1 K/uL (ref 1.7–7.7)
Neutrophils Relative %: 62 %
Platelets: 408 K/uL — ABNORMAL HIGH (ref 150–400)
RBC: 4.12 MIL/uL (ref 3.87–5.11)
RDW: 13.5 % (ref 11.5–15.5)
WBC: 6.6 K/uL (ref 4.0–10.5)
nRBC: 0 % (ref 0.0–0.2)

## 2024-11-01 LAB — PROTIME-INR
INR: 0.9 (ref 0.8–1.2)
Prothrombin Time: 13 s (ref 11.4–15.2)

## 2024-11-01 LAB — I-STAT CG4 LACTIC ACID, ED: Lactic Acid, Venous: 1 mmol/L (ref 0.5–1.9)

## 2024-11-01 MED ORDER — IOHEXOL 350 MG/ML SOLN
75.0000 mL | Freq: Once | INTRAVENOUS | Status: AC | PRN
  Administered 2024-11-01: 75 mL via INTRAVENOUS

## 2024-11-01 NOTE — ED Triage Notes (Addendum)
 Here for re-eval . Of wound left lower leg, states she was here on the 11/16 and has been on several antibiotics , no getting better.

## 2024-11-01 NOTE — ED Provider Notes (Signed)
 [name] EM[facility] AT [facility] Provider Note   CSN: [id number] Arrival date & time: 11/01/24  1122     Patient presents with: Wound Infection   [name] is a 73 y.o. female.  {Add pertinent medical, surgical, social history, OB history to HPI:1013} 73 year old female with prior medical history as detailed below presents for evaluation.  Patient with a laceration to the anterior left shin that occurred initially on November 9.  She reports that the laceration was repaired.  Sutures have since been taken out.  She complains of persistent pain and swelling to the area.  She is currently on both doxycycline  and Keflex.  A provider at the [facility] sent her into the ED for evaluation.  Initial laceration occurred after a wooden rung on a stool broke and stabbed into her left shin.  Patient is concerned about possible retained wooden fragment.  Prior orthopedic care performed by Dr. Ernie.  If patient requires orthopedic surgery, patient prefers Dr. Lyndle service.  The history is provided by the patient and medical records.       Prior to Admission medications   Medication Sig Start Date End Date Taking? Authorizing Provider  amoxicillin (AMOXIL) 500 MG capsule Take 2,000 mg by mouth See admin instructions. Takes 2000mg  amoxicillin prior to dental procedure.    [provider]  aspirin  EC 81 MG tablet Take 81 mg by mouth daily.    [provider]  Biotin  10000 MCG TABS Take 10,000 mg by mouth daily.     [provider]  Calcium  Carbonate-Vit D-Min (CALCIUM  1200 PO) Take 1,200 mg by mouth daily.    [provider]  Cholecalciferol  50 MCG (2000 UT) TABS Take 2,000 Units by mouth daily.    [provider]  docusate sodium  (COLACE) 100 MG capsule Take 1 capsule (100 mg total) by mouth 2 (two) times daily. 01/21/19   Danella Cough, PA-C  doxycycline  (VIBRAMYCIN ) 100 MG capsule Take 1 capsule (100 mg total) by mouth 2  (two) times daily. 10/26/24   Robinson, John K, PA-C  ibuprofen  (ADVIL ) 800 MG tablet Take 800 mg by mouth 2 (two) times daily as needed. 05/06/20   [provider]  Magnesium  400 MG CAPS Take 400 mg by mouth daily.     [provider]  Multiple Vitamins-Minerals (MULTIVITAMIN PO) Take 1 tablet by mouth daily.    [provider]  pyridOXINE  (VITAMIN B-6) 100 MG tablet Take 100 mg by mouth daily.    [provider]  traMADol (ULTRAM) 50 MG tablet Take 50 mg by mouth 3 (three) times daily as needed for pain. 05/06/20   [provider]    Allergies: Patient has no known allergies.    Review of Systems  All other systems reviewed and are negative.   Updated Vital Signs BP 128/79 (BP Location: Right Arm)   Pulse 74   Temp 97.7 F (36.5 C)   Resp 17   SpO2 99%   Physical Exam Vitals and nursing note reviewed.  Constitutional:      General: She is not in acute distress.    Appearance: Normal appearance. She is well-developed.  HENT:     Head: Normocephalic and atraumatic.  Eyes:     Conjunctiva/sclera: Conjunctivae normal.     Pupils: Pupils are equal, round, and reactive to light.  Cardiovascular:     Rate and Rhythm: Normal rate and regular rhythm.     Heart sounds: Normal heart sounds.  Pulmonary:     Effort: Pulmonary effort is normal. No respiratory distress.     Breath sounds: Normal breath sounds.  Abdominal:     General: There is no distension.     Palpations: Abdomen is soft.     Tenderness: There is no abdominal tenderness.  Musculoskeletal:        General: No deformity. Normal range of motion.     Cervical back: Normal range of motion and neck supple.  Skin:    General: Skin is warm and dry.     Comments: Chronic wound to the left shin.  Hematoma and surrounding ecchymosis present.  See images below.  Neurological:     General: No focal deficit present.     Mental Status: She is alert and oriented to person, place, and  time.     (all labs ordered are listed, but only abnormal results are displayed) Labs Reviewed  COMPREHENSIVE METABOLIC PANEL WITH GFR - Abnormal; Notable for the following components:      Result Value   Glucose, Bld 127 (*)    All other components within normal limits  CBC WITH DIFFERENTIAL/PLATELET - Abnormal; Notable for the following components:   Platelets 408 (*)    All other components within normal limits  I-STAT CHEM 8, ED - Abnormal; Notable for the following components:   Creatinine, Ser 1.10 (*)    Glucose, Bld 135 (*)    All other components within normal limits  CULTURE, BLOOD (ROUTINE X 2)  CULTURE, BLOOD (ROUTINE X 2)  PROTIME-INR  I-STAT CG4 LACTIC ACID, ED  I-STAT CG4 LACTIC ACID, ED    EKG: None  Radiology: No results found.  {Document cardiac monitor, telemetry assessment procedure when appropriate:32947} Procedures   Medications Ordered in the ED - No data to display    {Click here for ABCD2, HEART and other calculators REFRESH Note before signing:1}                              Medical Decision Making Amount and/or Complexity of Data Reviewed Labs: ordered. Radiology: ordered.   ***  {Document critical care time when appropriate  Document review of labs and clinical decision tools ie CHADS2VASC2, etc  Document your independent review of radiology images and any outside records  Document your discussion with family members, caretakers and with consultants  Document social determinants of health affecting pt's care  Document your decision making why or why not admission, treatments were needed:32947:::1}   Final diagnoses:  None    ED Discharge Orders     None

## 2024-11-01 NOTE — ED Triage Notes (Signed)
 [facility] on lower left leg that may need a surgical consult or IV antibiotics. Has been on multiple antibiotics and none  are working. Patient reports that the wound is draining yellowish white, the left lower extremity is swollen all the way to ankle, she reports reddness and warmth to touch also. Denies fever and chills.

## 2024-11-01 NOTE — Discharge Instructions (Addendum)
 Return for any problem.   Complete antibiotics as prescribed.

## 2024-11-01 NOTE — ED Provider Triage Note (Signed)
 Emergency Medicine Provider Triage Evaluation Note  ALBIE BAZIN , a 73 y.o. female  was evaluated in triage.  Pt complains of left leg pain from a laceration she had repaired on November 9.  Patient states that since that time the wound has become infected and her leg has become swollen, warm to the touch, and there is notable bruising.  Patient was seen at her PCP for the wound infection on the 16th and placed on an antibiotic.  Patient states that she has not had any improvement with the antibiotic and her PCP sent her here for reevaluation.  Review of Systems  Positive: Ecchymosis, edema, warmth Negative:  Physical Exam  BP 128/79 (BP Location: Right Arm)   Pulse 74   Temp 97.7 F (36.5 C)   Resp 17   SpO2 99%  Gen:   Awake, no distress   Resp:  Normal effort  MSK:   Moves extremities without difficulty  Other:    Medical Decision Making  Medically screening exam initiated at 11:46 AM.  Appropriate orders placed.  Dawnelle K Sick was informed that the remainder of the evaluation will be completed by another provider, this initial triage assessment does not replace that evaluation, and the importance of remaining in the ED until their evaluation is complete.    Willma Duwaine CROME, [place] 11/01/24 [phone]

## 2024-11-01 NOTE — Progress Notes (Signed)
 VASCULAR LAB    Left lower extremity venous duplex has been performed.  See CV proc for preliminary results.   Syncere Eble, RVT 11/01/2024, 5:34 PM

## 2024-11-01 NOTE — ED Provider Notes (Signed)
 I received the patient in signout with ultrasound pending.  Plan is for discharge if ultrasound is negative. Physical Exam  BP 118/71   Pulse 61   Temp 97.9 F (36.6 C) (Oral)   Resp 16   SpO2 100%   Physical Exam  Procedures  Procedures  ED Course / MDM   Clinical Course as of 11/01/24 1906  Sat Nov 01, 2024  1731 Negative for DVT per discussion with US  tech [BS]    Clinical Course User Index [BS] Arlee Katz, MD   Medical Decision Making Amount and/or Complexity of Data Reviewed Labs: ordered. Radiology: ordered.  Risk Prescription drug management.   The patient's ultrasound is negative and she is discharged with return precautions.       Ula Prentice SAUNDERS, MD 11/01/24 [phone]

## 2024-11-06 LAB — CULTURE, BLOOD (ROUTINE X 2)
Culture: NO GROWTH
Culture: NO GROWTH
Special Requests: ADEQUATE
Special Requests: ADEQUATE
